# Patient Record
Sex: Female | Born: 1990
Health system: Southern US, Community
[De-identification: ages and names within clinical notes are randomized; demographics above are authoritative.]

## PROBLEM LIST (undated history)

## (undated) DIAGNOSIS — K219 Gastro-esophageal reflux disease without esophagitis: Secondary | ICD-10-CM

## (undated) DIAGNOSIS — I10 Essential (primary) hypertension: Secondary | ICD-10-CM

## (undated) DIAGNOSIS — R000 Tachycardia, unspecified: Secondary | ICD-10-CM

## (undated) DIAGNOSIS — Z789 Other specified health status: Secondary | ICD-10-CM

## (undated) HISTORY — DX: Essential (primary) hypertension: I10

## (undated) HISTORY — PX: TONSILLECTOMY: SUR1361

## (undated) HISTORY — DX: Gastro-esophageal reflux disease without esophagitis: K21.9

## (undated) HISTORY — PX: WISDOM TOOTH EXTRACTION: SHX21

## (undated) HISTORY — DX: Tachycardia, unspecified: R00.0

---
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
## 2011-02-23 ENCOUNTER — Inpatient Hospital Stay (HOSPITAL_COMMUNITY)
Admission: AD | Admit: 2011-02-23 | Discharge: 2011-02-24 | Disposition: A | Discharge status: Home / Self Care | Payer: Self-pay | Source: Ambulatory Visit | Attending: Obstetrics & Gynecology | Admitting: Obstetrics & Gynecology

## 2011-02-23 ENCOUNTER — Encounter (HOSPITAL_COMMUNITY): Payer: Self-pay | Admitting: *Deleted

## 2011-02-23 DIAGNOSIS — N949 Unspecified condition associated with female genital organs and menstrual cycle: Secondary | ICD-10-CM

## 2011-02-23 DIAGNOSIS — A499 Bacterial infection, unspecified: Secondary | ICD-10-CM | POA: Insufficient documentation

## 2011-02-23 DIAGNOSIS — B9689 Other specified bacterial agents as the cause of diseases classified elsewhere: Secondary | ICD-10-CM | POA: Insufficient documentation

## 2011-02-23 DIAGNOSIS — R1032 Left lower quadrant pain: Secondary | ICD-10-CM | POA: Insufficient documentation

## 2011-02-23 DIAGNOSIS — R110 Nausea: Secondary | ICD-10-CM

## 2011-02-23 DIAGNOSIS — R102 Pelvic and perineal pain: Secondary | ICD-10-CM

## 2011-02-23 DIAGNOSIS — N946 Dysmenorrhea, unspecified: Secondary | ICD-10-CM | POA: Insufficient documentation

## 2011-02-23 DIAGNOSIS — N760 Acute vaginitis: Secondary | ICD-10-CM | POA: Insufficient documentation

## 2011-02-23 DIAGNOSIS — R1031 Right lower quadrant pain: Secondary | ICD-10-CM | POA: Insufficient documentation

## 2011-02-23 HISTORY — DX: Other specified health status: Z78.9

## 2011-02-23 LAB — URINALYSIS, ROUTINE W REFLEX MICROSCOPIC
Bilirubin Urine: NEGATIVE
Glucose, UA: NEGATIVE mg/dL
Ketones, ur: NEGATIVE mg/dL
Leukocytes, UA: NEGATIVE
pH: 5.5 (ref 5.0–8.0)

## 2011-02-23 MED ORDER — ONDANSETRON 8 MG PO TBDP
8.0000 mg | ORAL_TABLET | Freq: Once | ORAL | Status: AC
  Administered 2011-02-23: 8 mg via ORAL
  Filled 2011-02-23: qty 1

## 2011-02-23 NOTE — Progress Notes (Signed)
Pt reports pain in upper, lower , and both sides of abd x 2 weeks, also lower back pain . States she takes tylenol and it helps for a bit then the pain comes right back. Denies fever. States she started having nausea and vomiting about a week ago , mostly in the mornings. LMP 01/28/2011, sexually active condoms sometimes. G0

## 2011-02-23 NOTE — Progress Notes (Signed)
Pt presents to [facility] for pain in lower abdomen for last "couple of weeks".  States her back and sides have been hurting also.

## 2011-02-24 ENCOUNTER — Inpatient Hospital Stay (HOSPITAL_COMMUNITY): Payer: Self-pay

## 2011-02-24 LAB — COMPREHENSIVE METABOLIC PANEL
Albumin: 3.8 g/dL (ref 3.5–5.2)
Alkaline Phosphatase: 60 U/L (ref 39–117)
BUN: 14 mg/dL (ref 6–23)
Calcium: 9.6 mg/dL (ref 8.4–10.5)
Potassium: 3.5 mEq/L (ref 3.5–5.1)
Total Protein: 7 g/dL (ref 6.0–8.3)

## 2011-02-24 LAB — CBC
HCT: 38.1 % (ref 36.0–46.0)
MCH: 29.9 pg (ref 26.0–34.0)
MCHC: 36.5 g/dL — ABNORMAL HIGH (ref 30.0–36.0)
RDW: 13.9 % (ref 11.5–15.5)

## 2011-02-24 LAB — WET PREP, GENITAL: Yeast Wet Prep HPF POC: NONE SEEN

## 2011-02-24 LAB — GC/CHLAMYDIA PROBE AMP, GENITAL: Chlamydia, DNA Probe: NEGATIVE

## 2011-02-24 MED ORDER — KETOROLAC TROMETHAMINE 60 MG/2ML IM SOLN
60.0000 mg | Freq: Once | INTRAMUSCULAR | Status: AC
  Administered 2011-02-24: 60 mg via INTRAMUSCULAR
  Filled 2011-02-24: qty 2

## 2011-02-24 MED ORDER — IBUPROFEN 600 MG PO TABS: 600.0000 mg | ORAL_TABLET | Freq: Four times a day (QID) | ORAL | Status: DC | PRN

## 2011-02-24 MED ORDER — METRONIDAZOLE 500 MG PO TABS
500.0000 mg | ORAL_TABLET | Freq: Once | ORAL | Status: AC
  Administered 2011-02-24: 500 mg via ORAL
  Filled 2011-02-24: qty 1

## 2011-02-24 MED ORDER — METRONIDAZOLE 500 MG PO TABS: 500.0000 mg | ORAL_TABLET | Freq: Two times a day (BID) | ORAL | Status: AC

## 2011-02-24 NOTE — Progress Notes (Signed)
Pt denies need for pain med at this time.

## 2011-02-24 NOTE — ED Provider Notes (Signed)
History   The patient is a 20 y.o. year old female who presents to MAU reporting bilat groin pain L>R and LBP  X a few week and vomiting yesterday, resolved. She rates the groin pain 9/10 at its worst. She denies fever, chills, UTI Sx, vaginal discharge, abnormal bleeding or change in partners.   Chief Complaint  Patient presents with  . Abdominal Pain   Abdominal Pain This is a new problem. The current episode started 1 to 4 weeks ago. The onset quality is gradual. The problem occurs intermittently. The problem has been unchanged. The pain is located in the LLQ, RLQ and epigastric region. The pain is at a severity of 9/10. The quality of the pain is sharp. The abdominal pain does not radiate. Associated symptoms include nausea and vomiting. Pertinent negatives include no constipation, diarrhea, dysuria, frequency, hematuria or myalgias.    Pertinent Gynecological History: Menses: flow is moderate, regular every month without intermenstrual spotting and with minimal cramping Contraception: condoms DES exposure: unknown Blood transfusions: none Sexually transmitted diseases: no past history    Past Medical History  Diagnosis Date  . No pertinent past medical history     Past Surgical History  Procedure Date  . Tonsillectomy     No family history on file.  History  Substance Use Topics  . Smoking status: Never Smoker   . Smokeless tobacco: Not on file  . Alcohol Use: No    Allergies: No Known Allergies  Prescriptions prior to admission  Medication Sig Dispense Refill  . acetaminophen (TYLENOL) 500 MG tablet Take 500 mg by mouth every 6 (six) hours as needed. For pain or headache       . ibuprofen (ADVIL,MOTRIN) 200 MG tablet Take 200 mg by mouth every 6 (six) hours as needed. For pain       . triamcinolone (KENALOG) 0.1 % cream Apply 1 application topically 2 (two) times daily. For eczema         Review of Systems  Constitutional: Negative.   Gastrointestinal:  Positive for nausea, vomiting and abdominal pain. Negative for diarrhea, constipation and blood in stool.  Genitourinary: Negative for dysuria, urgency, frequency, hematuria and flank pain.  Musculoskeletal: Negative for myalgias.   Physical Exam   Blood pressure 138/90, pulse 83, temperature 99.2 F (37.3 C), temperature source Oral, resp. rate 18, height 5\' 2"  (1.575 m), weight 60.782 kg (134 lb), SpO2 99.00%.  Physical Exam  Constitutional: She is oriented to person, place, and time. She appears well-developed and well-nourished. No distress.  Cardiovascular: Normal rate.   Respiratory: Effort normal.  GI: Soft. Bowel sounds are normal. She exhibits no distension and no mass. There is tenderness (over L groin). There is no rebound and no guarding.  Genitourinary: Vagina normal and uterus normal. No vaginal discharge found.  Neurological: She is alert and oriented to person, place, and time.  Skin: Skin is warm. She is diaphoretic.  Psychiatric: She has a normal mood and affect.   Results for orders placed during the hospital encounter of 02/23/11 (from the past 24 hour(s))  URINALYSIS, ROUTINE W REFLEX MICROSCOPIC     Status: Abnormal   Collection Time   02/23/11  8:35 PM      Component Value Range   Color, Urine YELLOW  YELLOW    Appearance CLEAR  CLEAR    Specific Gravity, Urine >1.030 (*) 1.005 - 1.030    pH 5.5  5.0 - 8.0    Glucose, UA NEGATIVE  NEGATIVE (mg/dL)  Hgb urine dipstick NEGATIVE  NEGATIVE    Bilirubin Urine NEGATIVE  NEGATIVE    Ketones, ur NEGATIVE  NEGATIVE (mg/dL)   Protein, ur NEGATIVE  NEGATIVE (mg/dL)   Urobilinogen, UA 0.2  0.0 - 1.0 (mg/dL)   Nitrite NEGATIVE  NEGATIVE    Leukocytes, UA NEGATIVE  NEGATIVE   POCT PREGNANCY, URINE     Status: Normal   Collection Time   02/23/11 11:10 PM      Component Value Range   Preg Test, Ur NEGATIVE    WET PREP, GENITAL     Status: Abnormal   Collection Time   02/24/11 12:09 AM      Component Value Range    Yeast, Wet Prep NONE SEEN  NONE SEEN    Trich, Wet Prep NONE SEEN  NONE SEEN    Clue Cells, Wet Prep FEW (*) NONE SEEN    WBC, Wet Prep HPF POC FEW (*) NONE SEEN   CBC     Status: Abnormal   Collection Time   02/24/11  1:20 AM      Component Value Range   WBC 11.7 (*) 4.0 - 10.5 (K/uL)   RBC 4.65  3.87 - 5.11 (MIL/uL)   Hemoglobin 13.9  12.0 - 15.0 (g/dL)   HCT 40.9  81.1 - 91.4 (%)   MCV 81.9  78.0 - 100.0 (fL)   MCH 29.9  26.0 - 34.0 (pg)   MCHC 36.5 (*) 30.0 - 36.0 (g/dL)   RDW 78.2  95.6 - 21.3 (%)   Platelets 336  150 - 400 (K/uL)   US Transvaginal Non-ob  02/24/2011  *RADIOLOGY REPORT*  Clinical Data: Bilateral lower quadrant abdominal pain, right worse than left; pelvic pain.  TRANSABDOMINAL AND TRANSVAGINAL ULTRASOUND OF PELVIS Technique:  Both transabdominal and transvaginal ultrasound examinations of the pelvis were performed. Transabdominal technique was performed for global imaging of the pelvis including uterus, ovaries, adnexal regions, and pelvic cul-de-sac.  Comparison: None.   It was necessary to proceed with endovaginal exam following the transabdominal exam to visualize the uterus and ovaries.  Findings:  Uterus: Normal in size and appearance; measures 6.9 cm in length, 3.3 cm in AP dimension and 4.5 cm in transverse dimension.  Endometrium: Normal in thickness and appearance; measures 0.7 cm in thickness.  Right ovary:  Normal appearance/no adnexal mass; measures 2.2 x 1.7 x 2.0 cm.  Left ovary: Normal appearance/no adnexal mass; measures 3.5 x 1.9 x 2.1 cm.  Other findings: No free fluid is seen within the pelvic cul-de-sac.  IMPRESSION: Normal study. No evidence of pelvic mass or other significant abnormality.  Original Report Authenticated By: Tonia Ghent, M.D.     MAU Course  Procedures   Assessment and Plan  Assessment 1. BV 2. Dysmenorrhea  Plan: 1. Toraldol 2. Flagyl 500 mg PO BID x 7 days 3. GC/CT pending  Dorathy Kinsman 02/24/2011, 1:41 AM

## 2011-02-24 NOTE — Progress Notes (Signed)
SSE done per CNM.  Wet prep and cultures collected.  VE done per CNM.

## 2011-02-24 NOTE — Progress Notes (Signed)
V. Smith, CNM at bedside.  Assessment done and poc discussed with pt.  

## 2011-02-25 NOTE — ED Provider Notes (Signed)
Attestation of Attending Supervision of Advanced Practitioner: Evaluation and management procedures were performed by the PA/NP/CNM/OB Fellow under my supervision/collaboration. Chart reviewed and agree with management and plan.  ANYANWU,UGONNA A 02/25/2011 8:43 AM

## 2012-02-10 ENCOUNTER — Encounter (HOSPITAL_COMMUNITY): Payer: Self-pay | Admitting: Family Medicine

## 2012-02-10 ENCOUNTER — Emergency Department (HOSPITAL_COMMUNITY)
Admission: EM | Admit: 2012-02-10 | Discharge: 2012-02-10 | Disposition: A | Discharge status: Home / Self Care | Payer: Self-pay | Attending: Emergency Medicine | Admitting: Emergency Medicine

## 2012-02-10 DIAGNOSIS — K089 Disorder of teeth and supporting structures, unspecified: Secondary | ICD-10-CM | POA: Insufficient documentation

## 2012-02-10 NOTE — ED Notes (Signed)
Per pt sts left side mouth pain x 1 week. Pt also requesting pregnancy test

## 2012-02-10 NOTE — ED Notes (Signed)
No answer x3

## 2012-02-10 NOTE — ED Notes (Signed)
Called x2 for room. No answer. 

## 2012-03-11 ENCOUNTER — Inpatient Hospital Stay (HOSPITAL_COMMUNITY)
Admission: AD | Admit: 2012-03-11 | Discharge: 2012-03-11 | Disposition: A | Discharge status: Home / Self Care | Payer: Self-pay | Source: Ambulatory Visit | Attending: Obstetrics & Gynecology | Admitting: Obstetrics & Gynecology

## 2012-03-11 ENCOUNTER — Encounter (HOSPITAL_COMMUNITY): Payer: Self-pay

## 2012-03-11 DIAGNOSIS — K219 Gastro-esophageal reflux disease without esophagitis: Secondary | ICD-10-CM | POA: Insufficient documentation

## 2012-03-11 DIAGNOSIS — A5901 Trichomonal vulvovaginitis: Secondary | ICD-10-CM | POA: Insufficient documentation

## 2012-03-11 DIAGNOSIS — R1013 Epigastric pain: Secondary | ICD-10-CM | POA: Insufficient documentation

## 2012-03-11 LAB — URINE MICROSCOPIC-ADD ON

## 2012-03-11 LAB — URINALYSIS, ROUTINE W REFLEX MICROSCOPIC
Glucose, UA: NEGATIVE mg/dL
Ketones, ur: NEGATIVE mg/dL
Nitrite: NEGATIVE
Specific Gravity, Urine: 1.025 (ref 1.005–1.030)
pH: 6 (ref 5.0–8.0)

## 2012-03-11 LAB — WET PREP, GENITAL

## 2012-03-11 LAB — POCT PREGNANCY, URINE: Preg Test, Ur: NEGATIVE

## 2012-03-11 MED ORDER — METRONIDAZOLE 500 MG PO TABS: 500.0000 mg | ORAL_TABLET | Freq: Three times a day (TID) | ORAL | Status: DC

## 2012-03-11 MED ORDER — METRONIDAZOLE 500 MG PO TABS: 500.0000 mg | ORAL_TABLET | Freq: Once | ORAL | Status: DC

## 2012-03-11 NOTE — MAU Note (Signed)
Patient states she has been having upper abdominal and upper back pain for about one month. States every time she eats pizza she has vomiting( since 9-6).Has had a heavy vaginal discharge with an odor.

## 2012-03-11 NOTE — MAU Provider Note (Signed)
CC: Possible Pregnancy, Abdominal Pain and Vaginal Discharge    First Provider Initiated Contact with Patient 03/11/12 1732      HPI: Alicia Rodriguez ia a 21 y.o.  who presents with 2-3 wk history of epigastric abdominal pain that is intermittent and she has noticed it is worse when she eats pizza and other greasy foods. She does have some heartburn that occasionally causes her to vomit occasionally. No vomiting today.  Seen here 02/25/2012 for similar abdominal pain: dx was BV (few clues) and dysmenorrhea. Could not afford the Flagyl (but now can). She had a normal pelvic ultrasound. She was told the pain would likely resolve after her period but it did not. She has not treated the pain. Denies fever, illness, decreased appetite, diarrhea, constipation. No urinary symptoms. For the last week she has had abnormal vaginal discharge which she describes as thick mucusy and malodorous. New sexual partner does she's uncertain if her partner is monogamous. No contraception. Denies abnormal bleeding.   Past Medical History  Diagnosis Date  . No pertinent past medical history     OB History    Grav Para Term Preterm Abortions TAB SAB Ect Mult Living                  Past Surgical History  Procedure Date  . Tonsillectomy     History   Social History  . Marital Status: Single    Spouse Name: N/A    Number of Children: N/A  . Years of Education: N/A   Occupational History  . Not on file.   Social History Main Topics  . Smoking status: Current Every Day Smoker  . Smokeless tobacco: Not on file  . Alcohol Use: No  . Drug Use: No  . Sexually Active:    Other Topics Concern  . Not on file   Social History Narrative  . No narrative on file    No current facility-administered medications on file prior to encounter.   Current Outpatient Prescriptions on File Prior to Encounter  Medication Sig Dispense Refill  . acetaminophen (TYLENOL) 500 MG tablet Take 500 mg by mouth every 6  (six) hours as needed. For pain or headache       . triamcinolone (KENALOG) 0.1 % cream Apply 1 application topically 2 (two) times daily. For eczema         No Known Allergies  ROS: Pertinent items in HPI  PHYSICAL EXAM General: Well nourished, well developed female in no acute distress Cardiovascular: Normal rate Respiratory: Normal effort Abdomen: Soft, minimally tender in epigastric region only. No rebound or guarding. Back: No CVAT Extremities: No edema Neurologic: Alert and oriented  Speculum exam: NEFG; vagina with mucousy and malodorous discharge, no blood; cervix clean Bimanual exam: cervix closed, no CMT; uterus NSSP; no adnexal tenderness or masses  LAB RESULTS Results for orders placed during the hospital encounter of 03/11/12 (from the past 24 hour(s))  URINALYSIS, ROUTINE W REFLEX MICROSCOPIC     Status: Abnormal   Collection Time   03/11/12  5:15 PM      Component Value Range   Color, Urine YELLOW  YELLOW   APPearance HAZY (*) CLEAR   Specific Gravity, Urine 1.025  1.005 - 1.030   pH 6.0  5.0 - 8.0   Glucose, UA NEGATIVE  NEGATIVE mg/dL   Hgb urine dipstick NEGATIVE  NEGATIVE   Bilirubin Urine NEGATIVE  NEGATIVE   Ketones, ur NEGATIVE  NEGATIVE mg/dL   Protein, ur  NEGATIVE  NEGATIVE mg/dL   Urobilinogen, UA 0.2  0.0 - 1.0 mg/dL   Nitrite NEGATIVE  NEGATIVE   Leukocytes, UA TRACE (*) NEGATIVE  URINE MICROSCOPIC-ADD ON     Status: Abnormal   Collection Time   03/11/12  5:15 PM      Component Value Range   Squamous Epithelial / LPF FEW (*) RARE   WBC, UA 0-2  <3 WBC/hpf   Bacteria, UA FEW (*) RARE  POCT PREGNANCY, URINE     Status: Normal   Collection Time   03/11/12  5:25 PM      Component Value Range   Preg Test, Ur NEGATIVE  NEGATIVE  WET PREP, GENITAL     Status: Abnormal   Collection Time   03/11/12  5:35 PM      Component Value Range   Yeast Wet Prep HPF POC NONE SEEN  NONE SEEN   Trich, Wet Prep FEW (*) NONE SEEN   Clue Cells Wet Prep HPF POC FEW  (*) NONE SEEN   WBC, Wet Prep HPF POC MANY (*) NONE SEEN     ASSESSMENT  1. GERD (gastroesophageal reflux disease)   2. Trichomonal vaginitis     PLAN DIscharge home. See AVS for patient education on reflux and GERD diet. Try antacids  Rx Falgyl 2 Gm po  If worse abdominal pain advised to go to medical ED or Urgent Care.  Danae Orleans, CNM 03/11/2012 5:33 PM

## 2012-03-11 NOTE — MAU Note (Signed)
Patient states she has done two home pregnancy tests. A night sample was negative and an am sample the next morning was positive.

## 2012-03-11 NOTE — MAU Note (Signed)
Pt notes vaginal discharge with odor, having upper abdominal pain, possibly pregnant.

## 2012-03-12 LAB — GC/CHLAMYDIA PROBE AMP, GENITAL
Chlamydia, DNA Probe: NEGATIVE
GC Probe Amp, Genital: NEGATIVE

## 2013-04-06 ENCOUNTER — Emergency Department (HOSPITAL_COMMUNITY)
Admission: EM | Admit: 2013-04-06 | Discharge: 2013-04-06 | Disposition: A | Discharge status: Home / Self Care | Payer: Self-pay | Attending: Emergency Medicine | Admitting: Emergency Medicine

## 2013-04-06 ENCOUNTER — Emergency Department (HOSPITAL_COMMUNITY)
Admission: EM | Admit: 2013-04-06 | Discharge: 2013-04-06 | Disposition: A | Discharge status: Home / Self Care | Payer: Self-pay | Source: Home / Self Care

## 2013-04-06 ENCOUNTER — Encounter (HOSPITAL_COMMUNITY): Payer: Self-pay | Admitting: Emergency Medicine

## 2013-04-06 DIAGNOSIS — F17200 Nicotine dependence, unspecified, uncomplicated: Secondary | ICD-10-CM | POA: Insufficient documentation

## 2013-04-06 DIAGNOSIS — R42 Dizziness and giddiness: Secondary | ICD-10-CM | POA: Insufficient documentation

## 2013-04-06 DIAGNOSIS — IMO0002 Reserved for concepts with insufficient information to code with codable children: Secondary | ICD-10-CM | POA: Insufficient documentation

## 2013-04-06 DIAGNOSIS — R1084 Generalized abdominal pain: Secondary | ICD-10-CM | POA: Insufficient documentation

## 2013-04-06 DIAGNOSIS — R112 Nausea with vomiting, unspecified: Secondary | ICD-10-CM | POA: Insufficient documentation

## 2013-04-06 DIAGNOSIS — R109 Unspecified abdominal pain: Secondary | ICD-10-CM

## 2013-04-06 DIAGNOSIS — Z3202 Encounter for pregnancy test, result negative: Secondary | ICD-10-CM | POA: Insufficient documentation

## 2013-04-06 LAB — URINALYSIS, ROUTINE W REFLEX MICROSCOPIC
Glucose, UA: NEGATIVE mg/dL
Hgb urine dipstick: NEGATIVE
Ketones, ur: 15 mg/dL — AB
Protein, ur: NEGATIVE mg/dL
pH: 6 (ref 5.0–8.0)

## 2013-04-06 LAB — COMPREHENSIVE METABOLIC PANEL
ALT: 12 U/L (ref 0–35)
Albumin: 3.7 g/dL (ref 3.5–5.2)
Alkaline Phosphatase: 64 U/L (ref 39–117)
BUN: 12 mg/dL (ref 6–23)
Chloride: 102 mEq/L (ref 96–112)
Glucose, Bld: 81 mg/dL (ref 70–99)
Potassium: 4 mEq/L (ref 3.5–5.1)
Sodium: 137 mEq/L (ref 135–145)
Total Bilirubin: 0.4 mg/dL (ref 0.3–1.2)
Total Protein: 7.4 g/dL (ref 6.0–8.3)

## 2013-04-06 LAB — CBC WITH DIFFERENTIAL/PLATELET
Basophils Relative: 0 % (ref 0–1)
Eosinophils Absolute: 0.3 10*3/uL (ref 0.0–0.7)
Hemoglobin: 15.6 g/dL — ABNORMAL HIGH (ref 12.0–15.0)
Lymphs Abs: 4.1 10*3/uL — ABNORMAL HIGH (ref 0.7–4.0)
Monocytes Relative: 7 % (ref 3–12)
Neutro Abs: 5.6 10*3/uL (ref 1.7–7.7)
Neutrophils Relative %: 52 % (ref 43–77)
Platelets: 330 10*3/uL (ref 150–400)
RBC: 5.07 MIL/uL (ref 3.87–5.11)

## 2013-04-06 MED ORDER — HYDROCODONE-ACETAMINOPHEN 5-325 MG PO TABS: ORAL_TABLET | ORAL | Status: DC

## 2013-04-06 MED ORDER — ONDANSETRON 4 MG PO TBDP: 4.0000 mg | ORAL_TABLET | Freq: Three times a day (TID) | ORAL | Status: DC | PRN

## 2013-04-06 MED ORDER — OMEPRAZOLE 20 MG PO CPDR: DELAYED_RELEASE_CAPSULE | ORAL | Status: DC

## 2013-04-06 NOTE — ED Notes (Signed)
Report given to Brittney, RN

## 2013-04-06 NOTE — ED Notes (Signed)
Pt reports generalized sharp abd pains and n/v since Tuesday, feels lightheaded. Denies diarrhea. Denies urinary or vaginal symptoms.

## 2013-04-06 NOTE — ED Provider Notes (Signed)
Medical screening examination/treatment/procedure(s) were performed by non-physician practitioner and as supervising physician I was immediately available for consultation/collaboration.  EKG Interpretation   None         Richardean Canal, MD 04/06/13 2013

## 2013-04-06 NOTE — ED Notes (Signed)
Blood was collected by phlebotomy.

## 2013-04-06 NOTE — ED Notes (Signed)
Pt denies nausea at this time. Pt PO challenged. Resting comfortably. Denies pain.

## 2013-04-06 NOTE — ED Provider Notes (Signed)
CSN: [id number]     Arrival date & time 04/06/13  1715 History   First MD Initiated Contact with Patient 04/06/13 1727     Chief Complaint  Patient presents with  . Abdominal Pain  . Emesis   (Consider location/radiation/quality/duration/timing/severity/associated sxs/prior Treatment) HPI Comments: Patient presents complaint of sharp, generalized, nonradiating abdominal pain with associated nausea and vomiting for the past 2 days. Patient also states that she feels lightheaded but has not passed out. She denies fever, diarrhea, constipation, urinary symptoms. No vaginal discharge or vaginal bleeding. Her last menstrual period was 03/01/13. She essentially active. Patient states that she vomits after having a "heavy" meal. She feels that greasy foods do make the pain worse. Patient denies heavy alcohol or NSAID use. She has taken Tylenol without relief of pain. Patient has had similar pain in the past that she told was ulcers. Onset of symptoms gradual. Course is intermittent.  Patient is a 22 y.o. female presenting with abdominal pain and vomiting. The history is provided by the patient.  Abdominal Pain Associated symptoms: nausea and vomiting   Associated symptoms: no chest pain, no constipation, no cough, no diarrhea, no dysuria, no fever, no hematuria, no sore throat, no vaginal bleeding and no vaginal discharge   Emesis Associated symptoms: abdominal pain   Associated symptoms: no diarrhea, no headaches, no myalgias and no sore throat     Past Medical History  Diagnosis Date  . No pertinent past medical history    Past Surgical History  Procedure Laterality Date  . Tonsillectomy     Family History  Problem Relation Age of Onset  . Other Neg Hx    History  Substance Use Topics  . Smoking status: Current Every Day Smoker -- 0.25 packs/day    Types: Cigarettes  . Smokeless tobacco: Never Used  . Alcohol Use: No   OB History   Grav Para Term Preterm Abortions TAB SAB Ect Mult  Living   0              Review of Systems  Constitutional: Negative for fever.  HENT: Negative for rhinorrhea and sore throat.   Eyes: Negative for redness.  Respiratory: Negative for cough.   Cardiovascular: Negative for chest pain.  Gastrointestinal: Positive for nausea, vomiting and abdominal pain. Negative for diarrhea and constipation.  Genitourinary: Negative for dysuria, frequency, hematuria, vaginal bleeding and vaginal discharge.  Musculoskeletal: Negative for myalgias.  Skin: Negative for rash.  Neurological: Negative for headaches.    Allergies  Review of patient's allergies indicates no known allergies.  Home Medications   Current Outpatient Rx  Name  Route  Sig  Dispense  Refill  . acetaminophen (TYLENOL) 500 MG tablet   Oral   Take 500 mg by mouth every 6 (six) hours as needed. For pain or headache          . metroNIDAZOLE (FLAGYL) 500 MG tablet   Oral   Take 1 tablet (500 mg total) by mouth once.   4 tablet   0     Take 4 tabs once   . triamcinolone (KENALOG) 0.1 % cream   Topical   Apply 1 application topically 2 (two) times daily. For eczema           BP 127/79  Pulse 90  Temp(Src) 98.9 F (37.2 C) (Oral)  Resp 20  Wt 150 lb (68.04 kg)  BMI 26.98 kg/m2  SpO2 98%  LMP 03/02/2013 Physical Exam  Nursing note and vitals reviewed. Constitutional:  She appears well-developed and well-nourished.  HENT:  Head: Normocephalic and atraumatic.  Eyes: Conjunctivae are normal. Right eye exhibits no discharge. Left eye exhibits no discharge.  Neck: Normal range of motion. Neck supple.  Cardiovascular: Normal rate, regular rhythm and normal heart sounds.   Pulmonary/Chest: Effort normal and breath sounds normal.  Abdominal: Soft. There is tenderness in the right upper quadrant, epigastric area and left upper quadrant. There is no rebound and no guarding.  Neurological: She is alert.  Skin: Skin is warm and dry.  Psychiatric: She has a normal mood and  affect.    ED Course  Procedures (including critical care time) Labs Review Labs Reviewed  CBC WITH DIFFERENTIAL - Abnormal; Notable for the following:    WBC 10.8 (*)    Hemoglobin 15.6 (*)    MCHC 36.8 (*)    Lymphs Abs 4.1 (*)    All other components within normal limits  URINALYSIS, ROUTINE W REFLEX MICROSCOPIC - Abnormal; Notable for the following:    Bilirubin Urine SMALL (*)    Ketones, ur 15 (*)    All other components within normal limits  COMPREHENSIVE METABOLIC PANEL  POCT PREGNANCY, URINE   Imaging Review No results found.  EKG Interpretation   None      6:06 PM Patient seen and examined. Work-up initiated. Medications ordered.   Vital signs reviewed and are as follows: Filed Vitals:   04/06/13 1718  BP: 127/79  Pulse: 90  Temp: 98.9 F (37.2 C)  Resp: 20   8:06 PM patient informed of all results. She is dressed in room, ready to go. Patient seems relieved that she is not pregnant.  Will discharge to home with omeprazole, pain medicine, nausea medicine.  The patient was urged to return to the Emergency Department immediately with worsening of current symptoms, worsening abdominal pain, persistent vomiting, blood noted in stools, fever, or any other concerns. The patient verbalized understanding.     MDM   1. Abdominal pain    Patient with abdominal pain, upper abdominal but still diffuse and non-localized.  Vitals are stable, no fever.  No signs of dehydration, tolerating PO's.  Lungs are clear.  No focal abdominal pain, no concern for appendicitis, cholecystitis, pancreatitis, ruptured viscus, UTI, kidney stone, or any other abdominal etiology.  Supportive therapy indicated with return if symptoms worsen.  Patient counseled.     Renne Crigler, PA-C 04/06/13 2009

## 2013-07-16 ENCOUNTER — Encounter (HOSPITAL_BASED_OUTPATIENT_CLINIC_OR_DEPARTMENT_OTHER): Payer: Self-pay | Admitting: Emergency Medicine

## 2013-07-16 ENCOUNTER — Emergency Department (HOSPITAL_BASED_OUTPATIENT_CLINIC_OR_DEPARTMENT_OTHER)
Admission: EM | Admit: 2013-07-16 | Discharge: 2013-07-17 | Disposition: A | Discharge status: Home / Self Care | Payer: Self-pay | Attending: Emergency Medicine | Admitting: Emergency Medicine

## 2013-07-16 DIAGNOSIS — Z792 Long term (current) use of antibiotics: Secondary | ICD-10-CM | POA: Insufficient documentation

## 2013-07-16 DIAGNOSIS — N390 Urinary tract infection, site not specified: Secondary | ICD-10-CM

## 2013-07-16 DIAGNOSIS — R112 Nausea with vomiting, unspecified: Secondary | ICD-10-CM | POA: Insufficient documentation

## 2013-07-16 DIAGNOSIS — M549 Dorsalgia, unspecified: Secondary | ICD-10-CM

## 2013-07-16 DIAGNOSIS — L259 Unspecified contact dermatitis, unspecified cause: Secondary | ICD-10-CM | POA: Insufficient documentation

## 2013-07-16 DIAGNOSIS — A5901 Trichomonal vulvovaginitis: Secondary | ICD-10-CM

## 2013-07-16 DIAGNOSIS — Z79899 Other long term (current) drug therapy: Secondary | ICD-10-CM | POA: Insufficient documentation

## 2013-07-16 DIAGNOSIS — F17200 Nicotine dependence, unspecified, uncomplicated: Secondary | ICD-10-CM | POA: Insufficient documentation

## 2013-07-16 LAB — WET PREP, GENITAL: Yeast Wet Prep HPF POC: NONE SEEN

## 2013-07-16 LAB — PREGNANCY, URINE: Preg Test, Ur: NEGATIVE

## 2013-07-16 LAB — URINALYSIS, ROUTINE W REFLEX MICROSCOPIC
Bilirubin Urine: NEGATIVE
Glucose, UA: NEGATIVE mg/dL
Hgb urine dipstick: NEGATIVE
KETONES UR: NEGATIVE mg/dL
NITRITE: NEGATIVE
PROTEIN: NEGATIVE mg/dL
Specific Gravity, Urine: 1.017 (ref 1.005–1.030)
Urobilinogen, UA: 1 mg/dL (ref 0.0–1.0)
pH: 7.5 (ref 5.0–8.0)

## 2013-07-16 LAB — URINE MICROSCOPIC-ADD ON

## 2013-07-16 MED ORDER — METRONIDAZOLE 500 MG PO TABS: 500.0000 mg | ORAL_TABLET | Freq: Two times a day (BID) | ORAL | Status: DC

## 2013-07-16 MED ORDER — SULFAMETHOXAZOLE-TRIMETHOPRIM 800-160 MG PO TABS: 1.0000 | ORAL_TABLET | Freq: Two times a day (BID) | ORAL | Status: AC

## 2013-07-16 MED ORDER — CYCLOBENZAPRINE HCL 5 MG PO TABS: 5.0000 mg | ORAL_TABLET | Freq: Three times a day (TID) | ORAL | Status: DC | PRN

## 2013-07-16 MED ORDER — AZITHROMYCIN 250 MG PO TABS
1000.0000 mg | ORAL_TABLET | Freq: Once | ORAL | Status: AC
  Administered 2013-07-16: 1000 mg via ORAL
  Filled 2013-07-16: qty 4

## 2013-07-16 MED ORDER — CEFTRIAXONE SODIUM 250 MG IJ SOLR
250.0000 mg | Freq: Once | INTRAMUSCULAR | Status: AC
  Administered 2013-07-16: 250 mg via INTRAMUSCULAR
  Filled 2013-07-16: qty 250

## 2013-07-16 MED ORDER — LIDOCAINE HCL (PF) 1 % IJ SOLN
INTRAMUSCULAR | Status: AC
  Administered 2013-07-16: 5 mL
  Filled 2013-07-16: qty 5

## 2013-07-16 MED ORDER — KETOROLAC TROMETHAMINE 30 MG/ML IJ SOLN
30.0000 mg | Freq: Once | INTRAMUSCULAR | Status: AC
  Administered 2013-07-16: 30 mg via INTRAMUSCULAR
  Filled 2013-07-16: qty 1

## 2013-07-16 NOTE — ED Notes (Signed)
Patient c/o back pain that started Monday. Denies any other symptoms. Does not recall any recent injuries.

## 2013-07-16 NOTE — Discharge Instructions (Signed)
Your urine tonight appears to have infection. Your wet prep show that you have trichomonas. It is important that your sex partner be treated at the same time as you and no sex for one week after you have both been treated. Men usually do not have symptoms and will re infect you. We have tested you tonight for Gonorrhea and Chlamydia. The cultures will not be back for 24 hours. We will only call if the results are positive. You and your partner should follow up with the health department for further STD testing such as Syphilis, Hepatitis and HIV.

## 2013-07-16 NOTE — ED Provider Notes (Signed)
CSN: [id number]     Arrival date & time 07/16/13  2138 History   First MD Initiated Contact with Patient 07/16/13 2140     Chief Complaint  Patient presents with  . Back Pain   (Consider location/radiation/quality/duration/timing/severity/associated sxs/prior Treatment) Patient is a 23 y.o. female presenting with back pain. The history is provided by the patient.  Back Pain Location:  Generalized Quality:  Shooting and stabbing Radiates to: stomach. Pain severity:  Severe Pain is:  Same all the time Onset quality:  Gradual Duration:  6 days Timing:  Constant Progression:  Worsening Chronicity:  New Context: not falling, not lifting heavy objects, not MVA, not occupational injury, not physical stress and not recent injury   Relieved by:  Nothing Worsened by:  Movement, deep breathing, coughing, bending and ambulation Ineffective treatments: tylenol. Associated symptoms: abdominal pain   Associated symptoms: no bladder incontinence, no bowel incontinence, no fever, no leg pain, no numbness, no pelvic pain and no weakness  Dysuria: "funny feeling with urination"    [place] S Arpin is a 23 y.o. female who presents to the ED with back pain that started 6 days ago. She states that the pain is all over her back and radiates to the abdomen sometimes. She has had back pain in the past but this is different. She denies any injury. She states the only UTI symptoms she has is that it feels "funny" when she voids. She is sexually active without birth control. She denies loss of control of bladder or bowels.   Past Medical History  Diagnosis Date  . No pertinent past medical history    Past Surgical History  Procedure Laterality Date  . Tonsillectomy     Family History  Problem Relation Age of Onset  . Other Neg Hx    History  Substance Use Topics  . Smoking status: Current Every Day Smoker -- 0.25 packs/day    Types: Cigarettes  . Smokeless tobacco: Never Used  . Alcohol Use: No    OB History   Grav Para Term Preterm Abortions TAB SAB Ect Mult Living   0              Review of Systems  Constitutional: Negative for fever and chills.  HENT: Negative.   Eyes: Negative for visual disturbance.  Gastrointestinal: Positive for nausea, vomiting and abdominal pain. Negative for bowel incontinence.  Genitourinary: Negative for bladder incontinence, urgency, frequency and pelvic pain. Dysuria: "funny feeling with urination"  Musculoskeletal: Positive for back pain.  Skin: Rash: eczema.  Neurological: Negative for weakness and numbness.  Psychiatric/Behavioral: The patient is not nervous/anxious.     Allergies  Review of patient's allergies indicates no known allergies.  Home Medications   Current Outpatient Rx  Name  Route  Sig  Dispense  Refill  . cyclobenzaprine (FLEXERIL) 5 MG tablet   Oral   Take 1 tablet (5 mg total) by mouth 3 (three) times daily as needed for muscle spasms.   30 tablet   0   . HYDROcodone-acetaminophen (NORCO/VICODIN) 5-325 MG per tablet      Take 1-2 tablets every 6 hours as needed for severe pain   12 tablet   0   . metroNIDAZOLE (FLAGYL) 500 MG tablet   Oral   Take 1 tablet (500 mg total) by mouth 2 (two) times daily.   14 tablet   0   . omeprazole (PRILOSEC) 20 MG capsule      Take one capsule PO twice a day  for 3 days, then one capsule PO once a day.   20 capsule   0   . ondansetron (ZOFRAN ODT) 4 MG disintegrating tablet   Oral   Take 1 tablet (4 mg total) by mouth every 8 (eight) hours as needed for nausea.   6 tablet   0   . sulfamethoxazole-trimethoprim (BACTRIM DS,SEPTRA DS) 800-160 MG per tablet   Oral   Take 1 tablet by mouth 2 (two) times daily.   14 tablet   0    BP 118/90  Pulse 85  Temp(Src) 98.6 F (37 C) (Oral)  Resp 18  Ht 5\' 2"  (1.575 m)  Wt 150 lb (68.04 kg)  BMI 27.43 kg/m2  SpO2 100%  LMP 07/10/2013 Physical Exam  Nursing note and vitals reviewed. Constitutional: She is oriented to  person, place, and time. She appears well-developed and well-nourished. No distress.  HENT:  Head: Normocephalic and atraumatic.  Eyes: EOM are normal.  Neck: Neck supple.  Cardiovascular: Normal rate and regular rhythm.   Pulmonary/Chest: Effort normal. She has no wheezes. She has no rales.  Abdominal: Soft. Normal appearance and bowel sounds are normal. There is tenderness in the suprapubic area, left upper quadrant and left lower quadrant. There is CVA tenderness.  Genitourinary:  External genitalia without lesions. Watery discharge vaginal vault. Cervix with strawberry appearance, friable. No CMT, no adnexal tenderness, uterus without palpable enlargement.   Musculoskeletal: Normal range of motion.       Lumbar back: She exhibits tenderness and pain. She exhibits normal range of motion, no deformity, no spasm and normal pulse.       Back:  Tenderness with palpation bilateral lower back. No tenderness over the spinal column. Pedal pulses equal and strong bilateral, adequate circulation, good touch sensation. Straight leg raises without pain or difficulty.   Neurological: She is alert and oriented to person, place, and time. She has normal strength. No cranial nerve deficit or sensory deficit. Gait normal.  Reflex Scores:      Bicep reflexes are 2+ on the right side and 2+ on the left side.      Brachioradialis reflexes are 2+ on the right side and 2+ on the left side.      Patellar reflexes are 2+ on the right side and 2+ on the left side. Skin: Skin is warm and dry.  Psychiatric: She has a normal mood and affect. Her behavior is normal.   Results for orders placed during the hospital encounter of 07/16/13 (from the past 24 hour(s))  URINALYSIS, ROUTINE W REFLEX MICROSCOPIC     Status: Abnormal   Collection Time    07/16/13 10:00 PM      Result Value Range   Color, Urine YELLOW  YELLOW   APPearance CLOUDY (*) CLEAR   Specific Gravity, Urine 1.017  1.005 - 1.030   pH 7.5  5.0 - 8.0     Glucose, UA NEGATIVE  NEGATIVE mg/dL   Hgb urine dipstick NEGATIVE  NEGATIVE   Bilirubin Urine NEGATIVE  NEGATIVE   Ketones, ur NEGATIVE  NEGATIVE mg/dL   Protein, ur NEGATIVE  NEGATIVE mg/dL   Urobilinogen, UA 1.0  0.0 - 1.0 mg/dL   Nitrite NEGATIVE  NEGATIVE   Leukocytes, UA LARGE (*) NEGATIVE  PREGNANCY, URINE     Status: None   Collection Time    07/16/13 10:00 PM      Result Value Range   Preg Test, Ur NEGATIVE  NEGATIVE  URINE MICROSCOPIC-ADD ON  Status: Abnormal   Collection Time    07/16/13 10:00 PM      Result Value Range   Squamous Epithelial / LPF RARE  RARE   WBC, UA TOO NUMEROUS TO COUNT  <3 WBC/hpf   RBC / HPF 0-2  <3 RBC/hpf   Bacteria, UA MANY (*) RARE   Urine-Other MUCOUS PRESENT    WET PREP, GENITAL     Status: Abnormal   Collection Time    07/16/13 10:45 PM      Result Value Range   Yeast Wet Prep HPF POC NONE SEEN  NONE SEEN   Trich, Wet Prep MODERATE (*) NONE SEEN   Clue Cells Wet Prep HPF POC MODERATE (*) NONE SEEN   WBC, Wet Prep HPF POC FEW (*) NONE SEEN    ED Course  Procedures Rocephin 250 mg. IM, Zithromax 1 gram PO given her in the ED. MDM  23 y.o. female with low back pain that radiates to the lower abdomen x 6 days. Will treat for cervicitis, BV, trichomonas and UTI. Will also treat low back pain. I have reviewed this patient's vital signs, nurses notes, appropriate labs and discussed findings and plan of care with the patient. I encouraged patient to go with her sex partner to the health department for further testing and treatment. Patient voices understanding. Stable for discharge without concern for TOA or pyelonephritis at this time. Patient remains afebrile, she has no pelvic pain on exam. Cultures for GC, Chlamydia and urine pending.    Medication List    TAKE these medications       cyclobenzaprine 5 MG tablet  Commonly known as:  FLEXERIL  Take 1 tablet (5 mg total) by mouth 3 (three) times daily as needed for muscle spasms.      metroNIDAZOLE 500 MG tablet  Commonly known as:  FLAGYL  Take 1 tablet (500 mg total) by mouth 2 (two) times daily.     sulfamethoxazole-trimethoprim 800-160 MG per tablet  Commonly known as:  BACTRIM DS,SEPTRA DS  Take 1 tablet by mouth 2 (two) times daily.      ASK your doctor about these medications       HYDROcodone-acetaminophen 5-325 MG per tablet  Commonly known as:  NORCO/VICODIN  Take 1-2 tablets every 6 hours as needed for severe pain     omeprazole 20 MG capsule  Commonly known as:  PRILOSEC  Take one capsule PO twice a day for 3 days, then one capsule PO once a day.     ondansetron 4 MG disintegrating tablet  Commonly known as:  ZOFRAN ODT  Take 1 tablet (4 mg total) by mouth every 8 (eight) hours as needed for nausea.         Janne Napoleon, [place] 07/16/13 [phone]

## 2013-07-17 LAB — GC/CHLAMYDIA PROBE AMP
CT Probe RNA: POSITIVE — AB
GC PROBE AMP APTIMA: NEGATIVE

## 2013-07-19 LAB — URINE CULTURE

## 2013-07-19 NOTE — ED Provider Notes (Signed)
Medical screening examination/treatment/procedure(s) were performed by non-physician practitioner and as supervising physician I was immediately available for consultation/collaboration.  EKG Interpretation   None         Kathlen Sakurai, MD 07/19/13 0714 

## 2013-07-22 ENCOUNTER — Telehealth (HOSPITAL_COMMUNITY): Payer: Self-pay | Admitting: Emergency Medicine

## 2013-07-22 NOTE — ED Notes (Signed)
Post ED Visit - Positive Culture Follow-up  Culture report reviewed by antimicrobial stewardship pharmacist: []  Wes Dulaney, Pharm.D., BCPS []  Celedonio MiyamotoJeremy Frens, Pharm.D., BCPS []  Georgina PillionElizabeth Martin, [address]Ph[address]D., BCPS [x]  BraddockMinh Pham, [place]arm[place] BCPS, AAHIVP []  Estella HuskMichelle Turner, Pharm.D., BCPS, AAHIVP  Positive urine culture Treated with Sulfa-Trimeth, organism sensitive to the same and no further patient follow-up is required at this time.  Zeb ComfortHolland, Whittney Steenson 07/22/2013, 9:48 AM

## 2014-02-26 ENCOUNTER — Emergency Department (HOSPITAL_BASED_OUTPATIENT_CLINIC_OR_DEPARTMENT_OTHER)
Admission: EM | Admit: 2014-02-26 | Discharge: 2014-02-26 | Disposition: A | Discharge status: Home / Self Care | Payer: Self-pay | Attending: Emergency Medicine | Admitting: Emergency Medicine

## 2014-02-26 ENCOUNTER — Encounter (HOSPITAL_BASED_OUTPATIENT_CLINIC_OR_DEPARTMENT_OTHER): Payer: Self-pay | Admitting: Emergency Medicine

## 2014-02-26 DIAGNOSIS — Z79899 Other long term (current) drug therapy: Secondary | ICD-10-CM | POA: Insufficient documentation

## 2014-02-26 DIAGNOSIS — Z792 Long term (current) use of antibiotics: Secondary | ICD-10-CM | POA: Insufficient documentation

## 2014-02-26 DIAGNOSIS — K089 Disorder of teeth and supporting structures, unspecified: Secondary | ICD-10-CM | POA: Insufficient documentation

## 2014-02-26 DIAGNOSIS — F17200 Nicotine dependence, unspecified, uncomplicated: Secondary | ICD-10-CM | POA: Insufficient documentation

## 2014-02-26 DIAGNOSIS — K0889 Other specified disorders of teeth and supporting structures: Secondary | ICD-10-CM

## 2014-02-26 MED ORDER — OXYCODONE-ACETAMINOPHEN 5-325 MG PO TABS
1.0000 | ORAL_TABLET | Freq: Once | ORAL | Status: AC
  Administered 2014-02-26: 1 via ORAL
  Filled 2014-02-26: qty 1

## 2014-02-26 MED ORDER — IBUPROFEN 600 MG PO TABS: 600.0000 mg | ORAL_TABLET | Freq: Four times a day (QID) | ORAL | Status: DC | PRN

## 2014-02-26 MED ORDER — OXYCODONE-ACETAMINOPHEN 5-325 MG PO TABS: 1.0000 | ORAL_TABLET | Freq: Four times a day (QID) | ORAL | Status: DC | PRN

## 2014-02-26 MED ORDER — PENICILLIN V POTASSIUM 500 MG PO TABS: 500.0000 mg | ORAL_TABLET | Freq: Three times a day (TID) | ORAL | Status: DC

## 2014-02-26 NOTE — ED Notes (Signed)
Dental pain for a few days.

## 2014-02-26 NOTE — ED Provider Notes (Signed)
CSN: [id number]     Arrival date & time 02/26/14  1701 History   First MD Initiated Contact with Patient 02/26/14 1753     Chief Complaint  Patient presents with  . Dental Pain     (Consider location/radiation/quality/duration/timing/severity/associated sxs/prior Treatment) HPI Pt presenting with c/o left posterior lowar molar dental pain.  Pt states her tooth broke off several months ago and now pain has been going on for the past 3 days.  Also has pain beneath her left jaw.  No fever/chills.  No vomiting.  No difficulty swallowing or breathing.  Does not have a dentist.  There are no other associated systemic symptoms, there are no other alleviating or modifying factors.  Pain is throbbing and constant.  Has tried ibuprofen wtihout relief.  Past Medical History  Diagnosis Date  . No pertinent past medical history    Past Surgical History  Procedure Laterality Date  . Tonsillectomy     Family History  Problem Relation Age of Onset  . Other Neg Hx    History  Substance Use Topics  . Smoking status: Current Every Day Smoker -- 0.25 packs/day    Types: Cigarettes  . Smokeless tobacco: Never Used  . Alcohol Use: No   OB History   Grav Para Term Preterm Abortions TAB SAB Ect Mult Living   0              Review of Systems ROS reviewed and all otherwise negative except for mentioned in HPI    Allergies  Review of patient's allergies indicates no known allergies.  Home Medications   Prior to Admission medications   Medication Sig Start Date End Date Taking? Authorizing Provider  cyclobenzaprine (FLEXERIL) 5 MG tablet Take 1 tablet (5 mg total) by mouth 3 (three) times daily as needed for muscle spasms. 07/16/13   Hope Orlene Och, NP  HYDROcodone-acetaminophen (NORCO/VICODIN) 5-325 MG per tablet Take 1-2 tablets every 6 hours as needed for severe pain 04/06/13   Renne Crigler, PA-C  ibuprofen (ADVIL,MOTRIN) 600 MG tablet Take 1 tablet (600 mg total) by mouth every 6 (six) hours  as needed. 02/26/14   Ethelda Chick, MD  metroNIDAZOLE (FLAGYL) 500 MG tablet Take 1 tablet (500 mg total) by mouth 2 (two) times daily. 07/16/13   Hope Orlene Och, NP  omeprazole (PRILOSEC) 20 MG capsule Take one capsule PO twice a day for 3 days, then one capsule PO once a day. 04/06/13   Renne Crigler, PA-C  ondansetron (ZOFRAN ODT) 4 MG disintegrating tablet Take 1 tablet (4 mg total) by mouth every 8 (eight) hours as needed for nausea. 04/06/13   Renne Crigler, PA-C  oxyCODONE-acetaminophen (PERCOCET/ROXICET) 5-325 MG per tablet Take 1-2 tablets by mouth every 6 (six) hours as needed for severe pain. 02/26/14   Ethelda Chick, MD  penicillin v potassium (VEETID) 500 MG tablet Take 1 tablet (500 mg total) by mouth 3 (three) times daily. 02/26/14   Ethelda Chick, MD   BP 136/88  Pulse 80  Temp(Src) 97.9 F (36.6 C) (Oral)  Resp 18  Ht  (1.575 m)  Wt 136 lb (61.689 kg)  BMI 24.87 kg/m2  SpO2 100%  LMP 01/27/2014 Vitals reviewed Physical Exam Physical Examination: General appearance - alert, well appearing, and in no distress Mental status - alert, oriented to person, place, and time Eyes - no conjunctival injection, no scleral icterus, left lower posterior molar eroded down to the gum line with surrounding gingival erythema, no  significant abscess visualized, mild left sided facial swelling, no swelling under tongue Mouth - mucous membranes moist, pharynx normal without lesions Neck - supple, no significant adenopathy Chest - clear to auscultation, no wheezes, rales or rhonchi, symmetric air entry Heart - normal rate, regular rhythm, normal S1, S2, no murmurs, rubs, clicks or gallops Extremities - peripheral pulses normal, no pedal edema, no clubbing or cyanosis Skin - normal coloration and turgor, no rashes  ED Course  Procedures (including critical care time) Labs Review Labs Reviewed - No data to display  Imaging Review No results found.   EKG Interpretation None       MDM   Final diagnoses:  Pain, dental    Pt presenting with c/o left lower dental pain, tooth is eroded down to the gum line.  Pt started on abx and pain medication.  No dentist on call through [facility] system today- patient encouraged of the importance of obtaining followup with the dentist of her choice.      Ethelda Chick, MD 02/26/14 [phone]

## 2014-02-26 NOTE — Discharge Instructions (Signed)
Return to the ED with any concerns including fever/chills, vomiting and not able to keep down liquids or antibitoics, difficulty breathing or swallowing, decreased level of alertness/lethargy, or any other alarming symptoms °

## 2015-02-05 ENCOUNTER — Emergency Department (HOSPITAL_BASED_OUTPATIENT_CLINIC_OR_DEPARTMENT_OTHER)
Admission: EM | Admit: 2015-02-05 | Discharge: 2015-02-05 | Disposition: A | Discharge status: Home / Self Care | Payer: Self-pay | Attending: Emergency Medicine | Admitting: Emergency Medicine

## 2015-02-05 ENCOUNTER — Encounter (HOSPITAL_BASED_OUTPATIENT_CLINIC_OR_DEPARTMENT_OTHER): Payer: Self-pay | Admitting: *Deleted

## 2015-02-05 DIAGNOSIS — Z720 Tobacco use: Secondary | ICD-10-CM | POA: Insufficient documentation

## 2015-02-05 DIAGNOSIS — R1310 Dysphagia, unspecified: Secondary | ICD-10-CM | POA: Insufficient documentation

## 2015-02-05 DIAGNOSIS — H9202 Otalgia, left ear: Secondary | ICD-10-CM | POA: Insufficient documentation

## 2015-02-05 DIAGNOSIS — K029 Dental caries, unspecified: Secondary | ICD-10-CM | POA: Insufficient documentation

## 2015-02-05 DIAGNOSIS — K047 Periapical abscess without sinus: Secondary | ICD-10-CM | POA: Insufficient documentation

## 2015-02-05 MED ORDER — NAPROXEN 500 MG PO TABS: 500.0000 mg | ORAL_TABLET | Freq: Two times a day (BID) | ORAL | Status: DC

## 2015-02-05 MED ORDER — AMOXICILLIN 500 MG PO CAPS: 500.0000 mg | ORAL_CAPSULE | Freq: Three times a day (TID) | ORAL | Status: DC

## 2015-02-05 MED ORDER — AMOXICILLIN 500 MG PO CAPS
500.0000 mg | ORAL_CAPSULE | Freq: Once | ORAL | Status: AC
  Administered 2015-02-05: 500 mg via ORAL
  Filled 2015-02-05: qty 1

## 2015-02-05 MED ORDER — HYDROCODONE-ACETAMINOPHEN 5-325 MG PO TABS
1.0000 | ORAL_TABLET | Freq: Once | ORAL | Status: AC
  Administered 2015-02-05: 1 via ORAL
  Filled 2015-02-05: qty 1

## 2015-02-05 MED ORDER — HYDROCODONE-ACETAMINOPHEN 5-325 MG PO TABS: ORAL_TABLET | ORAL | Status: DC

## 2015-02-05 NOTE — ED Provider Notes (Signed)
CSN: [id number]     Arrival date & time 02/05/15  1204 History   First MD Initiated Contact with Patient 02/05/15 1228     Chief Complaint  Patient presents with  . Facial Pain     (Consider location/radiation/quality/duration/timing/severity/associated sxs/prior Treatment) HPI Comments: Patient presents with one week of left-sided facial pain with radiation to her ear. Patient also complains of sore throat and difficulty swallowing. She has not had any fevers, nausea, or vomiting. She states that it hurts to open her mouth widely and hurts to chew food. She has been able to tolerate fluids. No difficulty breathing or shortness of breath. No pain with movement of her head or neck. Symptoms started 1 week ago as a left-sided toothache on the bottom jaw. No history of diabetes or immunocompromise. She has taken Tylenol which helps her sleep. The onset of this condition was acute. The course is constant.     The history is provided by the patient.    Past Medical History  Diagnosis Date  . No pertinent past medical history    Past Surgical History  Procedure Laterality Date  . Tonsillectomy     Family History  Problem Relation Age of Onset  . Other Neg Hx    Social History  Substance Use Topics  . Smoking status: Current Every Day Smoker -- 0.50 packs/day    Types: Cigarettes  . Smokeless tobacco: Never Used  . Alcohol Use: No   OB History    Gravida Para Term Preterm AB TAB SAB Ectopic Multiple Living   0              Review of Systems  Constitutional: Negative for fever.  HENT: Positive for dental problem, ear pain, facial swelling and trouble swallowing (Able to swallow, pain with swallowing). Negative for sore throat.   Respiratory: Negative for shortness of breath and stridor.   Musculoskeletal: Negative for neck pain.  Skin: Negative for color change.  Neurological: Negative for headaches.      Allergies  Review of patient's allergies indicates no known  allergies.  Home Medications   Prior to Admission medications   Medication Sig Start Date End Date Taking? Authorizing Provider  amoxicillin (AMOXIL) 500 MG capsule Take 1 capsule (500 mg total) by mouth 3 (three) times daily. 02/05/15   Renne Crigler, PA-C  HYDROcodone-acetaminophen (NORCO/VICODIN) 5-325 MG per tablet Take 1-2 tablets every 6 hours as needed for severe pain 02/05/15   Renne Crigler, PA-C  naproxen (NAPROSYN) 500 MG tablet Take 1 tablet (500 mg total) by mouth 2 (two) times daily. 02/05/15   Renne Crigler, PA-C   BP 137/88 mmHg  Pulse 110  Temp(Src) 98.8 F (37.1 C) (Oral)  Resp 18  Ht 5\' 2"  (1.575 m)  Wt 115 lb (52.164 kg)  BMI 21.03 kg/m2  SpO2 100%  LMP 01/30/2015   Physical Exam  Constitutional: She appears well-developed and well-nourished.  HENT:  Head: Normocephalic and atraumatic.  Right Ear: Tympanic membrane, external ear and ear canal normal.  Left Ear: Tympanic membrane, external ear and ear canal normal.  Nose: Nose normal.  Mouth/Throat: Uvula is midline, oropharynx is clear and moist and mucous membranes are normal. No trismus in the jaw. Abnormal dentition. Dental caries present. No dental abscesses or uvula swelling. No tonsillar abscesses.  Patient with external tenderness inferior to ear and inferiorly over the angle of the jaw and tonsillar area. There is no significant tenderness externally of the neck. Patient does have tenderness of  the gums and erythema suggestive of dental infection around the left mandibular molars. No gross abscess palpated. No external erythema. No appreciable facial swelling. No swelling or woody texture underneath tongue. Mild trismus.  Eyes: Conjunctivae are normal.  Neck: Normal range of motion. Neck supple.  No neck swelling or Ludwig's angina  Lymphadenopathy:    She has no cervical adenopathy.  Neurological: She is alert.  Skin: Skin is warm and dry.  Psychiatric: She has a normal mood and affect.  Nursing note  and vitals reviewed.   ED Course  Procedures (including critical care time) Labs Review Labs Reviewed - No data to display  Imaging Review No results found. I have personally reviewed and evaluated these images and lab results as part of my medical decision-making.   EKG Interpretation None       1:10 PM Patient seen and examined. Medications ordered.   Vital signs reviewed and are as follows: BP 137/88 mmHg  Pulse 110  Temp(Src) 98.8 F (37.1 C) (Oral)  Resp 18  Ht  (1.575 m)  Wt 115 lb (52.164 kg)  BMI 21.03 kg/m2  SpO2 100%  LMP 01/30/2015   Had a long discussion with the patient and her mother regarding how to proceed. Patient at this point looks very well, is afebrile, nontoxic. She does not have any external signs of a deep space infection in her neck or Ludwig's angina. We discussed that symptoms could worsen. I offered treatment with antibodies and return in 24-48 hours for recheck versus CT now. I do not feel strongly about obtaining a CT at this time. However, patient needs to return immediately if she develops worsening swelling of her face or neck, is unable to swallow, has pressure in her neck, or fevers. Mother and patient are willing to forego imaging at this time and follow return precautions discussed. They seem reliable to return if symptoms do indeed worsen.  Patient counseled on use of narcotic pain medications. Counseled not to combine these medications with others containing tylenol. Urged not to drink alcohol, drive, or perform any other activities that requires focus while taking these medications. The patient verbalizes understanding and agrees with the plan.  Patient counseled to take prescribed medications as directed, return with worsening facial or neck swelling, and to follow-up with their dentist as soon as possible.    MDM   Final diagnoses:  Dental infection   Patient with suspected dental infection. Will treat with amoxicillin. No hard  signs of a deep space infection or abscess of the neck. No external signs of Ludwig's angina. No history of diabetes or immunocompromise which would make her high risk for these. Discussed return precautions as above. Patient appears well, nontoxic. She does not have a fever.    Renne Crigler, PA-C 02/05/15 1313  Mirian Mo, MD 02/06/15 [phone]

## 2015-02-05 NOTE — ED Notes (Signed)
C/o knot that is tender on right side. C/o right ear pain. No other sx.

## 2015-02-05 NOTE — Discharge Instructions (Signed)
Please read and follow all provided instructions.  Your diagnoses today include:  1. Dental infection     The exam and treatment you received today has been provided on an emergency basis only. This is not a substitute for complete medical or dental care.  Tests performed today include:  Vital signs. See below for your results today.   Medications prescribed:   Amoxicillin - antibiotic  You have been prescribed an antibiotic medicine: take the entire course of medicine even if you are feeling better. Stopping early can cause the antibiotic not to work.   Vicodin (hydrocodone/acetaminophen) - narcotic pain medication  DO NOT drive or perform any activities that require you to be awake and alert because this medicine can make you drowsy. BE VERY CAREFUL not to take multiple medicines containing Tylenol (also called acetaminophen). Doing so can lead to an overdose which can damage your liver and cause liver failure and possibly death.   Naproxen - anti-inflammatory pain medication  Do not exceed  naproxen every 12 hours, take with food  You have been prescribed an anti-inflammatory medication or NSAID. Take with food. Take smallest effective dose for the shortest duration needed for your pain. Stop taking if you experience stomach pain or vomiting.   Take any prescribed medications only as directed.  Home care instructions:  Follow any educational materials contained in this packet.  Follow-up instructions: Please follow-up with your dentist for further evaluation of your symptoms.   Dental Assistance: See below for dental referrals  Return instructions:   Please return to the Emergency Department if you experience worsening symptoms.  Please return if you develop a fever, you develop more swelling in your face or neck, you have trouble breathing or swallowing food.  Please return if you have any other emergent concerns.  Additional Information:  Your vital signs  today were: BP 137/88 mmHg   Pulse 110   Temp(Src) 98.8 F (37.1 C) (Oral)   Resp 18   Ht  (1.575 m)   Wt 115 lb (52.164 kg)   BMI 21.03 kg/m2   SpO2 100%   LMP 01/30/2015 If your blood pressure (BP) was elevated above 135/85 this visit, please have this repeated by your doctor within one month. -------------- Dental Care: Organization         Address  Phone  Notes  [facility] Department of [facility] [address] [place], [place] [phone] Accepts children up to age 71 who are enrolled in [place] or [place] Health Choice; pregnant women with a Medicaid card; and children who have applied for Medicaid or [place] Health Choice, but were declined, whose parents can pay a reduced fee at time of service.  [facility] Department of [facility]  [address], [place] [phone] Accepts children up to age 89 who are enrolled in [place] or [place] Health Choice; pregnant women with a Medicaid card; and children who have applied for Medicaid or [place] Health Choice, but were declined, whose parents can pay a reduced fee at time of service.  [facility]  [address] [place], [place] [phone] Patients are seen by appointment only. Walk-ins are not accepted. [facility] will see patients 65 years of age and older. Monday - Tuesday (8am-5pm) Most Wednesdays (8:30-5pm) $30 per visit, cash only  [facility]  [address], [facility] [phone] Patients are seen by appointment only. Walk-ins are  not accepted. [facility] will see patients 47 years of age and older. One Wednesday Evening (Monthly: Volunteer Based).  $30 per visit, cash only  [employer]  [phone] for adults; Children under age 62, call Graduate Pediatric Dentistry at [phone]. Children aged 73-14, please call [phone] to request a pediatric application.  Dental services are  provided in all areas of dental care including fillings, crowns and bridges, complete and partial dentures, implants, gum treatment, root canals, and extractions. Preventive care is also provided. Treatment is provided to both adults and children. Patients are selected via a lottery and there is often a waiting list.   [facility] [address] [place]  [phone] [email or web address]   [facility] [address] [place], [place] [phone], Ext. [phone] Second and Fourth Thursday of each month, opens at 6:30 AM; Clinic ends at 9 AM.  Patients are seen on a first-come first-served basis, and a limited number are seen during each clinic.   [facility]  [address] [place], [place] [phone]   Eligibility Requirements You must have lived in [place], [place], or [place] counties for at least the last three months.   You cannot be eligible for state or federal sponsored [place], including [employer], [place], or [employer].   You generally cannot be eligible for healthcare insurance through your employer.    How to apply: Eligibility screenings are held every Tuesday and Wednesday afternoon from 1:00 pm until 4:00 pm. You do not need an appointment for the interview!  [facility] [address], [place], [place] [phone]   [facility] Health Department  [phone]   [facility]  [phone]   [facility] Health Department  [phone]

## 2016-04-08 ENCOUNTER — Encounter (HOSPITAL_COMMUNITY): Payer: Self-pay | Admitting: *Deleted

## 2016-04-08 ENCOUNTER — Emergency Department (HOSPITAL_COMMUNITY)
Admission: EM | Admit: 2016-04-08 | Discharge: 2016-04-08 | Disposition: A | Discharge status: Home / Self Care | Payer: Self-pay | Attending: Emergency Medicine | Admitting: Emergency Medicine

## 2016-04-08 DIAGNOSIS — F17210 Nicotine dependence, cigarettes, uncomplicated: Secondary | ICD-10-CM | POA: Insufficient documentation

## 2016-04-08 DIAGNOSIS — R0981 Nasal congestion: Secondary | ICD-10-CM

## 2016-04-08 DIAGNOSIS — J069 Acute upper respiratory infection, unspecified: Secondary | ICD-10-CM | POA: Insufficient documentation

## 2016-04-08 NOTE — ED Notes (Signed)
See provider notes for assessment 

## 2016-04-08 NOTE — Discharge Instructions (Signed)
Take Mucinex DM as prescribed on the packaging, take Zyrtec at night, take Afrin as prescribed on the packaging but only for 3 days and be sure to drink plenty of fluids. Follow-up with the [facility] in 3 days if your symptoms are not improving. Return immediately to the emergency department if you experience worsening sore throat,  throat swelling, worsening fevers, difficulty swallowing, difficulty breathing or any other concerning symptoms.

## 2016-04-08 NOTE — ED Provider Notes (Signed)
[facility]-EMER[facility]Y DEPT Provider Note   CSN: [id number] Arrival date & time: 04/08/16  1548  By signing my name below, I, Linna Darner, attest that this documentation has been prepared under the direction and in the presence of Mattie Marlin, PA-C. Electronically Signed: Linna Darner, Scribe. 04/08/2016. 4:48 PM.  History   Chief Complaint Chief Complaint  Patient presents with  . Cough  . Fever  . Headache    The history is provided by the patient. No language interpreter was used.    HPI Comments: Alicia Rodriguez is a 25 y.o. female who presents to the Emergency Department complaining of gradual onset, constant, flu-like symptoms beginning 3 days ago. She endorses nasal congestion, rhinorrhea, postnasal drip, frontal headache, generalized myalgias, sore throat, and productive cough with undescribed mucous. Pt reports intermittent fever/chills since onset; she notes she has not measured her temperature since onset. She reports sore throat exacerbation with swallowing and headache exacerbation with leaning forward/bending over. She states her symptoms are worse at night. Pt notes her aunt recently experienced similar symptoms. She states the last time she experienced similar symptoms was many years ago when she was a child. She has tried Mucinex with no relief of her symptoms. She notes she smokes cigarettes "every now and then." Pt denies ear pain, ear discharge, abdominal pain, nausea, vomiting, CP, wheezing, SOB, rash, or any other associated symptoms.  Past Medical History:  Diagnosis Date  . No pertinent past medical history     There are no active problems to display for this patient.   Past Surgical History:  Procedure Laterality Date  . TONSILLECTOMY      OB History    Gravida Para Term Preterm AB Living   0             SAB TAB Ectopic Multiple Live Births                   Home Medications    Prior to Admission medications   Medication Sig Start Date End  Date Taking? Authorizing Provider  amoxicillin (AMOXIL) 500 MG capsule Take 1 capsule (500 mg total) by mouth 3 (three) times daily. 02/05/15   Renne Crigler, PA-C  HYDROcodone-acetaminophen (NORCO/VICODIN) 5-325 MG per tablet Take 1-2 tablets every 6 hours as needed for severe pain 02/05/15   Renne Crigler, PA-C  naproxen (NAPROSYN) 500 MG tablet Take 1 tablet (500 mg total) by mouth 2 (two) times daily. 02/05/15   Renne Crigler, PA-C    Family History Family History  Problem Relation Age of Onset  . Other Neg Hx     Social History Social History  Substance Use Topics  . Smoking status: Current Every Day Smoker    Packs/day: 0.50    Types: Cigarettes  . Smokeless tobacco: Never Used  . Alcohol use Yes     Comment: occ     Allergies   Review of patient's allergies indicates no known allergies.   Review of Systems Review of Systems  Constitutional: Positive for chills and fever.  HENT: Positive for congestion, postnasal drip, rhinorrhea and sore throat. Negative for ear discharge and ear pain.   Respiratory: Positive for cough (productive). Negative for shortness of breath and wheezing.   Cardiovascular: Negative for chest pain.  Gastrointestinal: Negative for abdominal pain, nausea and vomiting.  Musculoskeletal: Positive for myalgias (generalized).  Skin: Negative for rash.  Neurological: Positive for headaches.    Physical Exam Updated Vital Signs BP 120/75 (BP Location: Right Arm)  Pulse 98   Temp 98.6 F (37 C) (Oral)   Resp 18   LMP 03/11/2016   SpO2 100%   Physical Exam  Constitutional: She appears well-developed and well-nourished. No distress.  HENT:  Head: Normocephalic and atraumatic.  Nose: Mucosal edema and rhinorrhea present.  Mouth/Throat: Posterior oropharyngeal erythema present. No oropharyngeal exudate or posterior oropharyngeal edema.  Eyes: Conjunctivae are normal. Pupils are equal, round, and reactive to light.  Cardiovascular: Normal rate,  regular rhythm, normal heart sounds and intact distal pulses.   Pulmonary/Chest: Effort normal and breath sounds normal. No respiratory distress.  Lungs CTA.  Abdominal: Soft. There is no tenderness.  Musculoskeletal: Normal range of motion.  Lymphadenopathy:    She has no cervical adenopathy.  Neurological: She is alert. Coordination normal.  Skin: Skin is warm and dry. She is not diaphoretic.  Psychiatric: She has a normal mood and affect. Her behavior is normal.  Nursing note and vitals reviewed.   ED Treatments / Results  Labs (all labs ordered are listed, but only abnormal results are displayed) Labs Reviewed - No data to display  EKG  EKG Interpretation None       Radiology No results found.  Procedures Procedures (including critical care time)  DIAGNOSTIC STUDIES: Oxygen Saturation is 100% on RA, normal by my interpretation.    COORDINATION OF CARE: 4:57 PM Discussed treatment plan with pt at bedside and pt agreed to plan.  Medications Ordered in ED Medications - No data to display   Initial Impression / Assessment and Plan / ED Course  I have reviewed the triage vital signs and the nursing notes.  Pertinent labs & imaging results that were available during my care of the patient were reviewed by me and considered in my medical decision making (see chart for details).  Clinical Course   Patients symptoms are consistent with URI, likely viral etiology. Patient afebrile, VSS, well-appearing. Discussed that antibiotics are not indicated for viral infections. Pt will be discharged with symptomatic treatment.  Verbalizes understanding and is agreeable with plan. Instructed the patient to follow up with the [facility] in 3 days if symptoms are not improving. Discussed strict return precautions to the ED. Patient was understanding the discharge instructions. Pt is hemodynamically stable & in NAD prior to dc.  I personally  performed the services described in this documentation, which was scribed in my presence. The recorded information has been reviewed and is accurate.   Final Clinical Impressions(s) / ED Diagnoses   Final diagnoses:  Upper respiratory tract infection, unspecified type  Sinus congestion    New Prescriptions New Prescriptions   No medications on file     Jerre SimonJessica L Ravina Milner, [place] 04/08/16 1705    Glynn OctaveStephen Rancour, MD 04/08/16 2326

## 2016-04-08 NOTE — ED Triage Notes (Signed)
Pt is here with cough, headache, sore throat, bodyaches, and fever.  No fever now.

## 2016-06-28 ENCOUNTER — Encounter (HOSPITAL_BASED_OUTPATIENT_CLINIC_OR_DEPARTMENT_OTHER): Payer: Self-pay | Admitting: Emergency Medicine

## 2016-06-28 ENCOUNTER — Emergency Department (HOSPITAL_BASED_OUTPATIENT_CLINIC_OR_DEPARTMENT_OTHER)
Admission: EM | Admit: 2016-06-28 | Discharge: 2016-06-28 | Disposition: A | Discharge status: Home / Self Care | Payer: Self-pay | Attending: Emergency Medicine | Admitting: Emergency Medicine

## 2016-06-28 ENCOUNTER — Emergency Department (HOSPITAL_BASED_OUTPATIENT_CLINIC_OR_DEPARTMENT_OTHER): Payer: Self-pay

## 2016-06-28 DIAGNOSIS — R05 Cough: Secondary | ICD-10-CM | POA: Insufficient documentation

## 2016-06-28 DIAGNOSIS — R42 Dizziness and giddiness: Secondary | ICD-10-CM | POA: Insufficient documentation

## 2016-06-28 DIAGNOSIS — R519 Headache, unspecified: Secondary | ICD-10-CM

## 2016-06-28 DIAGNOSIS — R059 Cough, unspecified: Secondary | ICD-10-CM

## 2016-06-28 DIAGNOSIS — J3489 Other specified disorders of nose and nasal sinuses: Secondary | ICD-10-CM | POA: Insufficient documentation

## 2016-06-28 DIAGNOSIS — F17210 Nicotine dependence, cigarettes, uncomplicated: Secondary | ICD-10-CM | POA: Insufficient documentation

## 2016-06-28 DIAGNOSIS — R509 Fever, unspecified: Secondary | ICD-10-CM | POA: Insufficient documentation

## 2016-06-28 DIAGNOSIS — R51 Headache: Secondary | ICD-10-CM | POA: Insufficient documentation

## 2016-06-28 LAB — CBC WITH DIFFERENTIAL/PLATELET
BASOS ABS: 0 10*3/uL (ref 0.0–0.1)
BASOS PCT: 0 %
EOS ABS: 0.3 10*3/uL (ref 0.0–0.7)
Eosinophils Relative: 2 %
HCT: 41.7 % (ref 36.0–46.0)
HEMOGLOBIN: 15.4 g/dL — AB (ref 12.0–15.0)
Lymphocytes Relative: 34 %
Lymphs Abs: 5.4 10*3/uL — ABNORMAL HIGH (ref 0.7–4.0)
MCH: 30.2 pg (ref 26.0–34.0)
MCHC: 36.9 g/dL — AB (ref 30.0–36.0)
MCV: 81.8 fL (ref 78.0–100.0)
MONO ABS: 1.3 10*3/uL — AB (ref 0.1–1.0)
Monocytes Relative: 8 %
NEUTROS ABS: 9 10*3/uL — AB (ref 1.7–7.7)
NEUTROS PCT: 56 %
Platelets: 340 10*3/uL (ref 150–400)
RBC: 5.1 MIL/uL (ref 3.87–5.11)
RDW: 13.4 % (ref 11.5–15.5)
WBC: 16 10*3/uL — ABNORMAL HIGH (ref 4.0–10.5)

## 2016-06-28 LAB — URINALYSIS, ROUTINE W REFLEX MICROSCOPIC
BILIRUBIN URINE: NEGATIVE
Glucose, UA: NEGATIVE mg/dL
HGB URINE DIPSTICK: NEGATIVE
Ketones, ur: NEGATIVE mg/dL
Nitrite: NEGATIVE
Protein, ur: NEGATIVE mg/dL
Specific Gravity, Urine: 1.024 (ref 1.005–1.030)
pH: 6 (ref 5.0–8.0)

## 2016-06-28 LAB — BASIC METABOLIC PANEL
ANION GAP: 9 (ref 5–15)
BUN: 14 mg/dL (ref 6–20)
CALCIUM: 9.4 mg/dL (ref 8.9–10.3)
CO2: 22 mmol/L (ref 22–32)
CREATININE: 0.66 mg/dL (ref 0.44–1.00)
Chloride: 103 mmol/L (ref 101–111)
GFR calc non Af Amer: >60 mL/min (ref >60)
Glucose, Bld: 84 mg/dL (ref 65–99)
Potassium: 3.7 mmol/L (ref 3.5–5.1)
SODIUM: 134 mmol/L — AB (ref 135–145)

## 2016-06-28 LAB — URINALYSIS, MICROSCOPIC (REFLEX): RBC / HPF: NONE SEEN RBC/hpf (ref 0–5)

## 2016-06-28 LAB — PREGNANCY, URINE: PREG TEST UR: NEGATIVE

## 2016-06-28 LAB — CBG MONITORING, ED: Glucose-Capillary: 130 mg/dL — ABNORMAL HIGH (ref 65–99)

## 2016-06-28 MED ORDER — SODIUM CHLORIDE 0.9 % IV BOLUS (SEPSIS)
1000.0000 mL | Freq: Once | INTRAVENOUS | Status: AC
  Administered 2016-06-28: 1000 mL via INTRAVENOUS

## 2016-06-28 MED ORDER — KETOROLAC TROMETHAMINE 30 MG/ML IJ SOLN
30.0000 mg | Freq: Once | INTRAMUSCULAR | Status: AC
  Administered 2016-06-28: 30 mg via INTRAVENOUS
  Filled 2016-06-28: qty 1

## 2016-06-28 MED ORDER — DEXAMETHASONE SODIUM PHOSPHATE 10 MG/ML IJ SOLN
10.0000 mg | Freq: Once | INTRAMUSCULAR | Status: AC
  Administered 2016-06-28: 10 mg via INTRAVENOUS
  Filled 2016-06-28: qty 1

## 2016-06-28 MED ORDER — METOCLOPRAMIDE HCL 5 MG/ML IJ SOLN
10.0000 mg | Freq: Once | INTRAMUSCULAR | Status: AC
  Administered 2016-06-28: 10 mg via INTRAVENOUS
  Filled 2016-06-28: qty 2

## 2016-06-28 MED ORDER — DIPHENHYDRAMINE HCL 50 MG/ML IJ SOLN
25.0000 mg | Freq: Once | INTRAMUSCULAR | Status: AC
  Administered 2016-06-28: 25 mg via INTRAVENOUS
  Filled 2016-06-28: qty 1

## 2016-06-28 NOTE — ED Triage Notes (Signed)
Pt c/o headache, generalized weakness and frequency in urination since Thursday.

## 2016-06-28 NOTE — ED Notes (Addendum)
Patient denies pain and is resting comfortably, at dischage

## 2016-06-28 NOTE — ED Provider Notes (Signed)
[facility]   CSN: [id number] Arrival date & time: 06/28/16  1439  By signing my name below, I, Javier Docker, attest that this documentation has been prepared under the direction and in the presence of Cheri Fowler, PA-C. Electronically Signed: Javier Docker, ER Scribe. 01/25/2016. 4:27 PM.  History   Chief Complaint Chief Complaint  Patient presents with  . Headache  . Urinary Frequency   The history is provided by the patient and a parent. No language interpreter was used.    HPI Comments: Alicia Rodriguez is a 26 y.o. female who presents to the Emergency Department complaining of two days of gradually worsening bilateral sharp throbbing HA with associated light headedness on standing and photophobia. Sleeping makes her pain better and standing up suddenly makes it worse. She also states she has had one week of rhinorrhea, cough, and had a fever (102) three days ago. No fevers since then, and states her rhinorrhea and cough are improving.  She denies numbness, weakness, abdominal pain, dysuria, nausea or vomiting, urinary symptoms, AMS, confusion, neck stiffness, or back pain. She has no PMHx of migraine. Family hx of migraine.  She has no family hx of seizure. She has taken tylenol without relief. No family history of brain aneurysm.  She does not take any regular medications. She does not drink caffeine daily. She denies head injury, neck pain, or back pain.   Past Medical History:  Diagnosis Date  . No pertinent past medical history     There are no active problems to display for this patient.   Past Surgical History:  Procedure Laterality Date  . TONSILLECTOMY      OB History    Gravida Para Term Preterm AB Living   0             SAB TAB Ectopic Multiple Live Births                   Home Medications    Prior to Admission medications   Medication Sig Start Date End Date Taking? Authorizing Provider  amoxicillin (AMOXIL) 500 MG  capsule Take 1 capsule (500 mg total) by mouth 3 (three) times daily. 02/05/15   Renne Crigler, PA-C  HYDROcodone-acetaminophen (NORCO/VICODIN) 5-325 MG per tablet Take 1-2 tablets every 6 hours as needed for severe pain 02/05/15   Renne Crigler, PA-C  naproxen (NAPROSYN) 500 MG tablet Take 1 tablet (500 mg total) by mouth 2 (two) times daily. 02/05/15   Renne Crigler, PA-C    Family History Family History  Problem Relation Age of Onset  . Other Neg Hx     Social History Social History  Substance Use Topics  . Smoking status: Current Every Day Smoker    Packs/day: 0.50    Types: Cigarettes  . Smokeless tobacco: Never Used  . Alcohol use Yes     Comment: occ     Allergies   Patient has no known allergies.   Review of Systems Review of Systems  Constitutional: Negative for chills.  HENT: Positive for rhinorrhea.   Eyes: Positive for photophobia.  Genitourinary: Negative for dysuria.  Musculoskeletal: Negative for back pain.  Neurological: Positive for light-headedness. Negative for weakness and numbness.  A complete 10 system review of systems was obtained and all systems are negative except as noted in the HPI and PMH.    Physical Exam Updated Vital Signs BP 121/83 (BP Location: Left Arm)   Pulse 86   Temp 98.2  F (36.8 C) (Oral)   Resp 14   Ht 5\' 2"  (1.575 m)   Wt 63.5 kg   LMP 05/28/2016 (Exact Date)   SpO2 100%   BMI 25.61 kg/m   Physical Exam  Constitutional: She is oriented to person, place, and time. She appears well-developed and well-nourished.  Non-toxic appearance. She does not have a sickly appearance. She does not appear ill. No distress.  HENT:  Head: Normocephalic and atraumatic.  Mouth/Throat: Oropharynx is clear and moist.  Eyes: Conjunctivae are normal. Pupils are equal, round, and reactive to light.  Neck: Normal range of motion. Neck supple.  No nuchal rigidity.  Cardiovascular: Normal rate and regular rhythm.   Pulmonary/Chest: Effort  normal and breath sounds normal. No accessory muscle usage or stridor. No respiratory distress. She has no wheezes. She has no rhonchi. She has no rales.  Abdominal: Soft. Bowel sounds are normal. She exhibits no distension. There is no tenderness.  Musculoskeletal: Normal range of motion.  Lymphadenopathy:    She has no cervical adenopathy.  Neurological: She is alert and oriented to person, place, and time. Coordination normal.  Mental Status:   AOx3.  Speech clear without dysarthria. Cranial Nerves:  I-not tested  II-PERRLA  III, IV, VI-EOMs intact  V-temporal and masseter strength intact  VII-symmetrical facial movements intact, no facial droop  VIII-hearing grossly intact bilaterally  IX, X-gag intact  XI-strength of sternomastoid and trapezius muscles 5/5  XII-tongue midline Motor:   Good muscle bulk and tone  Strength 5/5 bilaterally in upper and lower extremities   Cerebellar--intact RAMs, finger to nose intact bilaterally.  Gait normal  No pronator drift Sensory:  Intact in upper and lower extremities  Skin: Skin is warm and dry. She is not diaphoretic.  Psychiatric: She has a normal mood and affect. Her behavior is normal.  Nursing note and vitals reviewed.    ED Treatments / Results  DIAGNOSTIC STUDIES: Oxygen Saturation is 100% on RA, normal by my interpretation.    COORDINATION OF CARE: 4:20 PM Discussed treatment plan with pt at bedside and pt agreed to plan.  Labs (all labs ordered are listed, but only abnormal results are displayed) Labs Reviewed  URINALYSIS, ROUTINE W REFLEX MICROSCOPIC - Abnormal; Notable for the following:       Result Value   Leukocytes, UA TRACE (*)    All other components within normal limits  URINALYSIS, MICROSCOPIC (REFLEX) - Abnormal; Notable for the following:    Bacteria, UA MANY (*)    Squamous Epithelial / LPF 0-5 (*)    All other components within normal limits  CBC WITH DIFFERENTIAL/PLATELET - Abnormal; Notable for the  following:    WBC 16.0 (*)    Hemoglobin 15.4 (*)    MCHC 36.9 (*)    Neutro Abs 9.0 (*)    Lymphs Abs 5.4 (*)    Monocytes Absolute 1.3 (*)    All other components within normal limits  BASIC METABOLIC PANEL - Abnormal; Notable for the following:    Sodium 134 (*)    All other components within normal limits  CBG MONITORING, ED - Abnormal; Notable for the following:    Glucose-Capillary 130 (*)    All other components within normal limits  URINE CULTURE  PREGNANCY, URINE    EKG  EKG Interpretation None       Radiology Ct Head Wo Contrast  Result Date: 06/28/2016 CLINICAL DATA:  Two days of gradually worsening BILATERAL sharp throbbing headache with associated lightheadedness with  standing and photophobia, 1 week of rhinorrhea with fever 3 days ago, smoker EXAM: CT HEAD WITHOUT CONTRAST TECHNIQUE: Contiguous axial images were obtained from the base of the skull through the vertex without intravenous contrast. Sagittal and coronal MPR images reconstructed from axial data set. COMPARISON:  08/12/2008 FINDINGS: Brain: Normal ventricular morphology. No midline shift or mass effect. Normal appearance of brain parenchyma. No intracranial hemorrhage, mass lesion, evidence of acute infarction, or extra-axial fluid collection. Vascular: Unremarkable Skull: Intact Sinuses/Orbits: Clear Other: N/A IMPRESSION: Normal exam. Electronically Signed   By: Ulyses SouthwardMark  Boles M.D.   On: 06/28/2016 17:44    Procedures Procedures (including critical care time)  Medications Ordered in ED Medications  sodium chloride 0.9 % bolus 1,000 mL (0 mLs Intravenous Stopped 06/28/16 1743)  metoCLOPramide (REGLAN) injection 10 mg (10 mg Intravenous Given 06/28/16 1701)  diphenhydrAMINE (BENADRYL) injection 25 mg (25 mg Intravenous Given 06/28/16 1654)  dexamethasone (DECADRON) injection 10 mg (10 mg Intravenous Given 06/28/16 1659)  ketorolac (TORADOL) 30 MG/ML injection 30 mg (30 mg Intravenous Given 06/28/16 1657)      Initial Impression / Assessment and Plan / ED Course  I have reviewed the triage vital signs and the nursing notes.  Pertinent labs & imaging results that were available during my care of the patient were reviewed by me and considered in my medical decision making (see chart for details).  Clinical Course    Patient presents for evaluation of 4 days of headache. The beginning, she states she did have a fever with some rhinorrhea, and cough. She states the symptoms have improved, but her headache has persisted. She denies any neck pain, neck stiffness, back pain, altered mental status, photophobia. No nausea or vomiting. No head injury or trauma. No family history of cerebral aneurysm. On exam, patient appears well, nontoxic or ill. Vitals reassuring. No focal neurological deficits on exam. No meningismus. CT obtained due to no similar prior headaches, this was normal.  UA without infection. She is not pregnant. No metabolic derangement. She does have a leukocytosis of 16, however this is nonspecific and could be related to her recent cold. Low clinical suspicion for meningitis. Patient feels improved after migraine cocktail and fluids. Instructed patient on symptomatic treatment. Follow up with PCP. Return precautions discussed. Stable for discharge.  Final Clinical Impressions(s) / ED Diagnoses   Final diagnoses:  Nonintractable headache, unspecified chronicity pattern, unspecified headache type  Cough    New Prescriptions New Prescriptions   No medications on file     I personally performed the services described in this documentation, which was scribed in my presence. The recorded information has been reviewed and is accurate.        Cheri FowlerKayla Janard Culp, PA-C 06/28/16 1901    Cheri FowlerKayla Louanna Vanliew, PA-C 06/28/16 1901    Doug SouSam Jacubowitz, MD 06/28/16 [phone]

## 2016-06-28 NOTE — Discharge Instructions (Signed)
Your labs and CT today are normal.  You likely have a cold that is causing your symptoms.  Take 800 mg ibuprofen and 1000 mg Tylenol every 6 hours for headache.  Follow up with your primary care physician in the next couple of days.  Return to the ED for sudden worsening headache, persistent vomiting, numbness, weakness, or any new or concerning symptoms.

## 2016-06-30 LAB — URINE CULTURE

## 2017-05-10 ENCOUNTER — Emergency Department (HOSPITAL_BASED_OUTPATIENT_CLINIC_OR_DEPARTMENT_OTHER)
Admission: EM | Admit: 2017-05-10 | Discharge: 2017-05-10 | Disposition: A | Discharge status: Home / Self Care | Payer: Self-pay | Attending: Emergency Medicine | Admitting: Emergency Medicine

## 2017-05-10 ENCOUNTER — Encounter (HOSPITAL_BASED_OUTPATIENT_CLINIC_OR_DEPARTMENT_OTHER): Payer: Self-pay

## 2017-05-10 ENCOUNTER — Other Ambulatory Visit: Payer: Self-pay

## 2017-05-10 DIAGNOSIS — L0291 Cutaneous abscess, unspecified: Secondary | ICD-10-CM

## 2017-05-10 DIAGNOSIS — Z23 Encounter for immunization: Secondary | ICD-10-CM | POA: Insufficient documentation

## 2017-05-10 DIAGNOSIS — N764 Abscess of vulva: Secondary | ICD-10-CM | POA: Insufficient documentation

## 2017-05-10 DIAGNOSIS — F17210 Nicotine dependence, cigarettes, uncomplicated: Secondary | ICD-10-CM | POA: Insufficient documentation

## 2017-05-10 MED ORDER — DOXYCYCLINE HYCLATE 100 MG PO CAPS: 100.0000 mg | ORAL_CAPSULE | Freq: Two times a day (BID) | ORAL | 0 refills | Status: AC

## 2017-05-10 MED ORDER — LIDOCAINE HCL 2 % IJ SOLN
10.0000 mL | Freq: Once | INTRAMUSCULAR | Status: AC
  Administered 2017-05-10: 200 mg
  Filled 2017-05-10: qty 20

## 2017-05-10 MED ORDER — TETANUS-DIPHTH-ACELL PERTUSSIS 5-2.5-18.5 LF-MCG/0.5 IM SUSP
0.5000 mL | Freq: Once | INTRAMUSCULAR | Status: AC
  Administered 2017-05-10: 0.5 mL via INTRAMUSCULAR
  Filled 2017-05-10: qty 0.5

## 2017-05-10 NOTE — ED Provider Notes (Signed)
[facility] EMERGENCY DEPARTMENT Provider Note   CSN: [id number] Arrival date & time: 05/10/17  1646     History   Chief Complaint Chief Complaint  Patient presents with  . Abscess    HPI Alicia Rodriguez is a 26 y.o. female presenting with pain of left outer labia.  Patient states that for the past 4 days, she has had pain of her left outer labia.  It started as a small bump, has steadily grew and become more painful.  She denies fevers, chills, nausea, or vomiting.  She denies urinary symptoms or vaginal discharge.  It is constant, nothing makes the pain better or worse.  She has not taken any medicine including Tylenol or ibuprofen.  She denies any drainage from the area.  She reports history of boils on her abdomen that required surgery.  She does not know when her last tetanus shot was, does not think it was within the past 10 years.  She has no other medical problems, does not take medications daily including blood thinners.   HPI  Past Medical History:  Diagnosis Date  . No pertinent past medical history     There are no active problems to display for this patient.   Past Surgical History:  Procedure Laterality Date  . TONSILLECTOMY      OB History    Gravida Para Term Preterm AB Living   0             SAB TAB Ectopic Multiple Live Births                   Home Medications    Prior to Admission medications   Medication Sig Start Date End Date Taking? Authorizing Provider  doxycycline (VIBRAMYCIN) 100 MG capsule Take 1 capsule (100 mg total) by mouth 2 (two) times daily for 10 days. 05/10/17 05/20/17  Attila Mccarthy, PA-C    Family History Family History  Problem Relation Age of Onset  . Other Neg Hx     Social History Social History   Tobacco Use  . Smoking status: Current Every Day Smoker    Packs/day: 0.50    Types: Cigarettes  . Smokeless tobacco: Never Used  Substance Use Topics  . Alcohol use: Yes    Comment: occ  . Drug  use: No     Allergies   Patient has no known allergies.   Review of Systems Review of Systems  Constitutional: Negative for chills and fever.  Genitourinary: Positive for vaginal pain.     Physical Exam Updated Vital Signs BP 124/86 (BP Location: Right Arm)   Pulse 92   Temp 98.1 F (36.7 C) (Oral)   Resp 20   Ht 5\' 2"  (1.575 m)   Wt 60.7 kg (133 lb 13.1 oz)   SpO2 98%   BMI 24.48 kg/m   Physical Exam  Constitutional: She is oriented to person, place, and time. She appears well-developed and well-nourished. No distress.  HENT:  Head: Normocephalic and atraumatic.  Eyes: EOM are normal.  Neck: Normal range of motion.  Cardiovascular: Normal rate, regular rhythm and intact distal pulses.  Pulmonary/Chest: Effort normal and breath sounds normal. No respiratory distress. She has no wheezes. She has no rales.  Abdominal: She exhibits no distension.  Genitourinary:  Genitourinary Comments: Chaperone present.  Approximately 1.5 cm diameter fluctuant, well-circumscribed lesion of the left outer labia.  No extension towards the vagina or rectum.  Musculoskeletal: Normal range of motion.  Neurological: She  is alert and oriented to person, place, and time.  Skin: Skin is warm. No rash noted.  Psychiatric: She has a normal mood and affect.  Nursing note and vitals reviewed.    ED Treatments / Results  Labs (all labs ordered are listed, but only abnormal results are displayed) Labs Reviewed - No data to display  EKG  EKG Interpretation None       Radiology No results found.  Procedures .Marland Kitchen.Incision and Drainage Date/Time: 05/10/2017 7:15 PM Performed by: Alveria Apleyaccavale, Tyrique Sporn, PA-C Authorized by: Alveria Apleyaccavale, Nessie Nong, PA-C   Consent:    Consent obtained:  Verbal   Consent given by:  Patient   Risks discussed:  Bleeding, incomplete drainage, infection and pain Location:    Type:  Abscess   Location:  Anogenital   Anogenital location: L outer labia. Pre-procedure  details:    Skin preparation:  Betadine Anesthesia (see MAR for exact dosages):    Anesthesia method:  Local infiltration   Local anesthetic:  Lidocaine 2% w/o epi Procedure type:    Complexity:  Simple Procedure details:    Incision types:  Single straight   Incision depth:  Dermal   Scalpel blade:  11   Wound management:  Probed and deloculated and irrigated with saline   Drainage:  Bloody and purulent   Drainage amount:  Moderate   Wound treatment:  Wound left open   Packing materials:  None Post-procedure details:    Patient tolerance of procedure:  Tolerated well, no immediate complications   (including critical care time)  Medications Ordered in ED Medications  Tdap (BOOSTRIX) injection 0.5 mL (0.5 mLs Intramuscular Given 05/10/17 1829)  lidocaine (XYLOCAINE) 2 % (with pres) injection 200 mg (200 mg Infiltration Given by Other 05/10/17 1830)     Initial Impression / Assessment and Plan / ED Course  I have reviewed the triage vital signs and the nursing notes.  Pertinent labs & imaging results that were available during my care of the patient were reviewed by me and considered in my medical decision making (see chart for details).     Patient with abscess of left outer labia.  No systemic signs of infection including fever, chills, vomiting, or tachycardia.  Tetanus updated today.  I&D performed, purulent material expressed.  Discussed aftercare instructions.  As it is in a difficult area to drain, will place on doxycycline.  Patient to return if symptoms worsen.  At this time, patient present for discharge.  Return precautions given.  Patient states she understands and agrees to plan.   Final Clinical Impressions(s) / ED Diagnoses   Final diagnoses:  Abscess    ED Discharge Orders        Ordered    doxycycline (VIBRAMYCIN) 100 MG capsule  2 times daily     05/10/17 1916       Alveria ApleyCaccavale, Nolyn Eilert, PA-C 05/11/17 0116    Arby BarrettePfeiffer, Marcy, MD 05/12/17 [phone]

## 2017-05-10 NOTE — Discharge Instructions (Signed)
Take antibiotics as prescribed.  Take the entire course of antibiotics, even if your symptoms are improving. Use Tylenol or ibuprofen as needed for pain. Keep the area covered for the next 24 hours.  After this, you may remove dressing, wash gently soap and water, and reapply new dressing. Keep a dressing on until there is no more bleeding or draining. Wear cotton underwear that is breathable. Try to avoid tight fitting pants, staying with loose skirts, dresses, or loose pants. Return to the emergency room if you develop fevers, chills, vomiting, returning symptoms, or any new or worsening symptoms.

## 2017-05-10 NOTE — ED Notes (Signed)
Pt discharged to home with family. NAD.  

## 2017-05-10 NOTE — ED Triage Notes (Signed)
C/o vaginal abscess x4 days-NAD-steady gait

## 2017-05-14 MED FILL — DOXYCYCLINE HYCLATE 100 MG: 100 | 10 days supply | Qty: 20 | Fill #0

## 2017-07-19 ENCOUNTER — Emergency Department (HOSPITAL_BASED_OUTPATIENT_CLINIC_OR_DEPARTMENT_OTHER)
Admission: EM | Admit: 2017-07-19 | Discharge: 2017-07-19 | Disposition: A | Discharge status: Home / Self Care | Payer: Self-pay | Attending: Emergency Medicine | Admitting: Emergency Medicine

## 2017-07-19 ENCOUNTER — Other Ambulatory Visit: Payer: Self-pay

## 2017-07-19 ENCOUNTER — Encounter (HOSPITAL_BASED_OUTPATIENT_CLINIC_OR_DEPARTMENT_OTHER): Payer: Self-pay | Admitting: *Deleted

## 2017-07-19 DIAGNOSIS — R109 Unspecified abdominal pain: Secondary | ICD-10-CM | POA: Insufficient documentation

## 2017-07-19 DIAGNOSIS — Z5321 Procedure and treatment not carried out due to patient leaving prior to being seen by health care provider: Secondary | ICD-10-CM | POA: Insufficient documentation

## 2017-07-19 LAB — URINALYSIS, ROUTINE W REFLEX MICROSCOPIC
Glucose, UA: NEGATIVE mg/dL
Hgb urine dipstick: NEGATIVE
KETONES UR: 15 mg/dL — AB
LEUKOCYTES UA: NEGATIVE
NITRITE: NEGATIVE
PROTEIN: NEGATIVE mg/dL
Specific Gravity, Urine: >1.03 — ABNORMAL HIGH (ref 1.005–1.030)
pH: 6 (ref 5.0–8.0)

## 2017-07-19 LAB — PREGNANCY, URINE: PREG TEST UR: NEGATIVE

## 2017-07-19 NOTE — ED Notes (Signed)
Unable to locate  [employer]   Vending area    Bathrooms   And outside

## 2017-07-19 NOTE — ED Notes (Addendum)
Not in ED WR when called for tx area 

## 2017-07-19 NOTE — ED Triage Notes (Signed)
Pt c/o diffuse abd pain  And n/v/d

## 2017-09-08 ENCOUNTER — Other Ambulatory Visit: Payer: Self-pay

## 2017-09-08 ENCOUNTER — Encounter (HOSPITAL_BASED_OUTPATIENT_CLINIC_OR_DEPARTMENT_OTHER): Payer: Self-pay

## 2017-09-08 ENCOUNTER — Emergency Department (HOSPITAL_BASED_OUTPATIENT_CLINIC_OR_DEPARTMENT_OTHER)
Admission: EM | Admit: 2017-09-08 | Discharge: 2017-09-08 | Disposition: A | Discharge status: Home / Self Care | Payer: Medicaid Other | Attending: Emergency Medicine | Admitting: Emergency Medicine

## 2017-09-08 DIAGNOSIS — K0889 Other specified disorders of teeth and supporting structures: Secondary | ICD-10-CM

## 2017-09-08 DIAGNOSIS — F17210 Nicotine dependence, cigarettes, uncomplicated: Secondary | ICD-10-CM | POA: Insufficient documentation

## 2017-09-08 MED ORDER — PENICILLIN V POTASSIUM 500 MG PO TABS: 500.0000 mg | ORAL_TABLET | Freq: Four times a day (QID) | ORAL | 0 refills | Status: AC

## 2017-09-08 MED ORDER — NAPROXEN 500 MG PO TABS: 500.0000 mg | ORAL_TABLET | Freq: Two times a day (BID) | ORAL | 0 refills | Status: DC

## 2017-09-08 MED FILL — PENICILLIN VK 500 MG TABLET: 500 | 7 days supply | Qty: 28 | Fill #0

## 2017-09-08 MED FILL — NAPROXEN 500 MG TABLET: 500 | 15 days supply | Qty: 30 | Fill #0

## 2017-09-08 NOTE — ED Provider Notes (Signed)
[facility] EMERGENCY DEPARTMENT Provider Note   CSN: [id number] Arrival date & time: 09/08/17  1208     History   Chief Complaint Chief Complaint  Patient presents with  . Dental Pain    HPI Alicia Rodriguez is a 27 y.o. female.  HPI  Alicia Rodriguez is a 27 year old female with a history of eczema who presents to the emergency department for evaluation of left lower tooth pain.  Patient reports that pain began yesterday morning.  She states that she had a molar removed last year, and now has pain over the gumline in that area.  She reports pain only occurs with chewing or drinking cold liquids.  She states that at its worst pain is about a 6/10 in severity and feels "throbbing."  She has tried taking Tylenol without significant improvement of her symptoms.  She also noted some mild swelling over the left lower jaw which concerned her.  She denies fevers, chills, throat closing, trouble breathing, trismus, neck pain/swelling, abdominal pain, nausea/vomiting.  She does not have a Education officer, communitydentist.  Past Medical History:  Diagnosis Date  . No pertinent past medical history     There are no active problems to display for this patient.   Past Surgical History:  Procedure Laterality Date  . TONSILLECTOMY       OB History    Gravida  0   Para      Term      Preterm      AB      Living        SAB      TAB      Ectopic      Multiple      Live Births               Home Medications    Prior to Admission medications   Not on File    Family History Family History  Problem Relation Age of Onset  . Other Neg Hx     Social History Social History   Tobacco Use  . Smoking status: Current Every Day Smoker    Packs/day: 0.50    Types: Cigarettes  . Smokeless tobacco: Never Used  Substance Use Topics  . Alcohol use: Yes    Comment: occ  . Drug use: No     Allergies   Patient has no known allergies.   Review of Systems Review of Systems    Constitutional: Negative for chills and fever.  HENT: Positive for dental problem and facial swelling.   Gastrointestinal: Negative for abdominal pain, nausea and vomiting.  Musculoskeletal: Negative for neck pain.  Skin: Negative for color change.     Physical Exam Updated Vital Signs BP 116/79 (BP Location: Left Arm)   Pulse 84   Temp 98.3 F (36.8 C) (Oral)   Resp 16   Ht 5\' 2"  (1.575 m)   Wt 61.2 kg (135 lb)   LMP 08/12/2017   SpO2 100%   BMI 24.69 kg/m   Physical Exam  Constitutional: She appears well-developed and well-nourished. No distress.  HENT:  Head: Normocephalic and atraumatic.  Mouth/Throat:    Dental cavities and poor oral dentition noted. Pain along gumline where there is a missing molar as depicted in image. No abscess noted. Midline uvula. No trismus. OP moist and clear. No oropharyngeal erythema or edema. Neck supple with no tenderness. Mild left lower facial swelling, non-tender to palpation. No erythema, warmth over the face.    Eyes:  Right eye exhibits no discharge. Left eye exhibits no discharge.  Pulmonary/Chest: Effort normal. No respiratory distress.  Neurological: She is alert. Coordination normal.  Skin: She is not diaphoretic.  Psychiatric: She has a normal mood and affect. Her behavior is normal.  Nursing note and vitals reviewed.    ED Treatments / Results  Labs (all labs ordered are listed, but only abnormal results are displayed) Labs Reviewed - No data to display  EKG None  Radiology No results found.  Procedures Procedures (including critical care time)  Medications Ordered in ED Medications - No data to display   Initial Impression / Assessment and Plan / ED Course  I have reviewed the triage vital signs and the nursing notes.  Pertinent labs & imaging results that were available during my care of the patient were reviewed by me and considered in my medical decision making (see chart for details).    Patient with  toothache and mild left lower facial swelling.  No gross abscess.  Exam unconcerning for Ludwig's angina or spread of infection.  Will treat with penicillin and anti-inflammatories medicine.  Urged patient to follow-up with dentist and have provided her with dental resource guide in the emergency department.  Counseled her on return precautions and patient agrees and voiced understanding to the above plan.  She appears reliable to follow-up.  Final Clinical Impressions(s) / ED Diagnoses   Final diagnoses:  Pain, dental    ED Discharge Orders        Ordered    penicillin v potassium (VEETID) 500 MG tablet  4 times daily     09/08/17 1622    naproxen (NAPROSYN) 500 MG tablet  2 times daily     09/08/17 1622       Kellie Shropshire, PA-C 09/08/17 1624    Alvira Monday, MD 09/10/17 1348

## 2017-09-08 NOTE — ED Triage Notes (Signed)
C/o left lower toothache x 2 days-states she had tooth pulled from area last year-NAD-steady gait

## 2017-09-08 NOTE — Discharge Instructions (Signed)
I read your prescription for antibiotic.  Please take this as prescribed.  Please take all of your antibiotics until finished!   You may develop abdominal discomfort or diarrhea from the antibiotic.  You may help offset this with probiotics which you can buy or get in yogurt. Do not eat  or take the probiotics until 2 hours after your antibiotic.   Schedule an appointment with dentist, using the dental resource guide provided.  As we discussed, return to the emergency department if you develop a fever or swelling spreads to your neck.  Please also return if you have trouble breathing or have any new or concerning symptoms.

## 2017-12-20 ENCOUNTER — Encounter (HOSPITAL_BASED_OUTPATIENT_CLINIC_OR_DEPARTMENT_OTHER): Payer: Self-pay | Admitting: *Deleted

## 2017-12-20 ENCOUNTER — Emergency Department (HOSPITAL_BASED_OUTPATIENT_CLINIC_OR_DEPARTMENT_OTHER)
Admission: EM | Admit: 2017-12-20 | Discharge: 2017-12-20 | Disposition: A | Discharge status: Home / Self Care | Payer: Medicaid Other | Attending: Emergency Medicine | Admitting: Emergency Medicine

## 2017-12-20 ENCOUNTER — Other Ambulatory Visit: Payer: Self-pay

## 2017-12-20 DIAGNOSIS — R12 Heartburn: Secondary | ICD-10-CM | POA: Insufficient documentation

## 2017-12-20 DIAGNOSIS — R1013 Epigastric pain: Secondary | ICD-10-CM

## 2017-12-20 DIAGNOSIS — R112 Nausea with vomiting, unspecified: Secondary | ICD-10-CM | POA: Insufficient documentation

## 2017-12-20 DIAGNOSIS — F17210 Nicotine dependence, cigarettes, uncomplicated: Secondary | ICD-10-CM | POA: Insufficient documentation

## 2017-12-20 DIAGNOSIS — R197 Diarrhea, unspecified: Secondary | ICD-10-CM | POA: Insufficient documentation

## 2017-12-20 LAB — COMPREHENSIVE METABOLIC PANEL
ALBUMIN: 4.2 g/dL (ref 3.5–5.0)
ALT: 14 U/L (ref 0–44)
AST: 20 U/L (ref 15–41)
Alkaline Phosphatase: 54 U/L (ref 38–126)
Anion gap: 9 (ref 5–15)
BUN: 15 mg/dL (ref 6–20)
CHLORIDE: 107 mmol/L (ref 98–111)
CO2: 23 mmol/L (ref 22–32)
CREATININE: 0.86 mg/dL (ref 0.44–1.00)
Calcium: 9.5 mg/dL (ref 8.9–10.3)
GFR calc non Af Amer: >60 mL/min (ref >60)
Glucose, Bld: 101 mg/dL — ABNORMAL HIGH (ref 70–99)
Potassium: 3.5 mmol/L (ref 3.5–5.1)
SODIUM: 139 mmol/L (ref 135–145)
Total Bilirubin: 0.5 mg/dL (ref 0.3–1.2)
Total Protein: 7.8 g/dL (ref 6.5–8.1)

## 2017-12-20 LAB — CBC WITH DIFFERENTIAL/PLATELET
Basophils Absolute: 0 10*3/uL (ref 0.0–0.1)
Basophils Relative: 0 %
EOS PCT: 2 %
Eosinophils Absolute: 0.3 10*3/uL (ref 0.0–0.7)
HCT: 40.2 % (ref 36.0–46.0)
HEMOGLOBIN: 15.2 g/dL — AB (ref 12.0–15.0)
LYMPHS ABS: 5.1 10*3/uL — AB (ref 0.7–4.0)
LYMPHS PCT: 41 %
MCH: 31.2 pg (ref 26.0–34.0)
MCHC: 37.8 g/dL — ABNORMAL HIGH (ref 30.0–36.0)
MCV: 82.5 fL (ref 78.0–100.0)
Monocytes Absolute: 0.9 10*3/uL (ref 0.1–1.0)
Monocytes Relative: 7 %
NEUTROS PCT: 50 %
Neutro Abs: 6.1 10*3/uL (ref 1.7–7.7)
Platelets: 334 10*3/uL (ref 150–400)
RBC: 4.87 MIL/uL (ref 3.87–5.11)
RDW: 13.2 % (ref 11.5–15.5)
WBC: 12.3 10*3/uL — ABNORMAL HIGH (ref 4.0–10.5)

## 2017-12-20 LAB — PREGNANCY, URINE: PREG TEST UR: NEGATIVE

## 2017-12-20 LAB — URINALYSIS, ROUTINE W REFLEX MICROSCOPIC
Bilirubin Urine: NEGATIVE
GLUCOSE, UA: NEGATIVE mg/dL
HGB URINE DIPSTICK: NEGATIVE
Ketones, ur: NEGATIVE mg/dL
LEUKOCYTES UA: NEGATIVE
Nitrite: NEGATIVE
PROTEIN: NEGATIVE mg/dL
pH: 5.5 (ref 5.0–8.0)

## 2017-12-20 LAB — LIPASE, BLOOD: Lipase: 32 U/L (ref 11–51)

## 2017-12-20 MED ORDER — OMEPRAZOLE 20 MG PO CPDR: 20.0000 mg | DELAYED_RELEASE_CAPSULE | Freq: Every day | ORAL | 0 refills | Status: DC

## 2017-12-20 NOTE — ED Provider Notes (Signed)
[facility] EMERGENCY DEPARTMENT Provider Note   CSN: [id number] Arrival date & time: 12/20/17  1519     History   Chief Complaint Chief Complaint  Patient presents with  . Abdominal Pain    HPI Alicia Rodriguez is a 27 y.o. female.  27 year old female who presents with abdominal pain.  Over the past few months, the patient has had intermittent episodes of upper abdominal pain that often occur in the morning after she wakes up.  The pain is in the midepigastrium and feels crampy in nature.  She will sometimes have associated nausea and vomiting of "foamy" emesis.  She occasionally has episodes of diarrhea as well.  She denies any blood in the stool.  She denies any postprandial pain but does endorse heartburn.  She has not tried any medications for her symptoms.  She presents today because the intermittent crampy pain has been worse over the past 2 days.  She denies any alcohol use or heavy NSAID use.  No vaginal bleeding, vaginal discharge, urinary symptoms, fevers, or cough/cold symptoms.  The history is provided by the patient.  Abdominal Pain   Associated symptoms include diarrhea, nausea and vomiting. Pertinent negatives include fever and dysuria.    Past Medical History:  Diagnosis Date  . No pertinent past medical history     There are no active problems to display for this patient.   Past Surgical History:  Procedure Laterality Date  . TONSILLECTOMY       OB History    Gravida  0   Para      Term      Preterm      AB      Living        SAB      TAB      Ectopic      Multiple      Live Births               Home Medications    Prior to Admission medications   Medication Sig Start Date End Date Taking? Authorizing Provider  naproxen (NAPROSYN) 500 MG tablet Take 1 tablet (500 mg total) by mouth 2 (two) times daily. 09/08/17   Kellie ShropshireShrosbree, Emily J, PA-C  omeprazole (PRILOSEC) 20 MG capsule Take 1 capsule (20 mg total) by mouth daily.  12/20/17   Garlen Reinig, Ambrose Finlandachel Morgan, MD    Family History Family History  Problem Relation Age of Onset  . Other Neg Hx     Social History Social History   Tobacco Use  . Smoking status: Current Every Day Smoker    Packs/day: 0.50    Types: Cigarettes  . Smokeless tobacco: Never Used  Substance Use Topics  . Alcohol use: Yes    Comment: occ  . Drug use: No     Allergies   Patient has no known allergies.   Review of Systems Review of Systems  Constitutional: Negative for fever.  Respiratory: Negative for cough.   Gastrointestinal: Positive for abdominal pain, diarrhea, nausea and vomiting. Negative for blood in stool.  Genitourinary: Negative for dysuria, vaginal bleeding and vaginal discharge.   All other systems reviewed and are negative except that which was mentioned in HPI   Physical Exam Updated Vital Signs BP (!) 130/97 (BP Location: Left Arm)   Pulse 90   Temp 98.4 F (36.9 C) (Oral)   Resp 18   Ht 5\' 2"  (1.575 m)   Wt 63.5 kg (140 lb)   LMP 11/22/2017  SpO2 100%   BMI 25.61 kg/m   Physical Exam  Constitutional: She is oriented to person, place, and time. She appears well-developed and well-nourished. No distress.  HENT:  Head: Normocephalic and atraumatic.  Mouth/Throat: Oropharynx is clear and moist.  Moist mucous membranes  Eyes: Conjunctivae are normal.  Neck: Neck supple.  Cardiovascular: Normal rate, regular rhythm and normal heart sounds.  No murmur heard. Pulmonary/Chest: Effort normal and breath sounds normal.  Abdominal: Soft. Bowel sounds are normal. She exhibits no distension. There is tenderness in the epigastric area. There is no rebound and no guarding.  Musculoskeletal: She exhibits no edema.  Neurological: She is alert and oriented to person, place, and time.  Fluent speech  Skin: Skin is warm and dry.  Psychiatric: She has a normal mood and affect. Judgment normal.  Nursing note and vitals reviewed.    ED Treatments /  Results  Labs (all labs ordered are listed, but only abnormal results are displayed) Labs Reviewed  URINALYSIS, ROUTINE W REFLEX MICROSCOPIC - Abnormal; Notable for the following components:      Result Value   Specific Gravity, Urine >1.030 (*)    All other components within normal limits  COMPREHENSIVE METABOLIC PANEL - Abnormal; Notable for the following components:   Glucose, Bld 101 (*)    All other components within normal limits  CBC WITH DIFFERENTIAL/PLATELET - Abnormal; Notable for the following components:   WBC 12.3 (*)    Hemoglobin 15.2 (*)    MCHC 37.8 (*)    Lymphs Abs 5.1 (*)    All other components within normal limits  PREGNANCY, URINE  LIPASE, BLOOD    EKG None  Radiology No results found.  Procedures Procedures (including critical care time)  Medications Ordered in ED Medications - No data to display   Initial Impression / Assessment and Plan / ED Course  I have reviewed the triage vital signs and the nursing notes.  Pertinent labs & imaging results that were available during my care of the patient were reviewed by me and considered in my medical decision making (see chart for details).     Mild epigastric tenderness on exam.  Labs unremarkable including normal LFTs and lipase. Her pattern of symptoms in the morning associated with epigastric pain suggests possible GERD or PUD.   Will treat with PPI course and counseled on GERD diet. Extensively reviewed return precautions. Instructed to f/u with PCP to assess for improvement. She voiced understanding. Final Clinical Impressions(s) / ED Diagnoses   Final diagnoses:  Epigastric pain    ED Discharge Orders        Ordered    omeprazole (PRILOSEC) 20 MG capsule  Daily     12/20/17 1728       Yash Cacciola, Ambrose Finland, MD 12/20/17 1729

## 2017-12-20 NOTE — ED Triage Notes (Signed)
Pt c/o lower abd pain and nausea with dizziness x 3 days

## 2018-02-21 ENCOUNTER — Other Ambulatory Visit: Payer: Self-pay

## 2018-02-21 ENCOUNTER — Encounter (HOSPITAL_BASED_OUTPATIENT_CLINIC_OR_DEPARTMENT_OTHER): Payer: Self-pay | Admitting: *Deleted

## 2018-02-21 ENCOUNTER — Emergency Department (HOSPITAL_BASED_OUTPATIENT_CLINIC_OR_DEPARTMENT_OTHER)
Admission: EM | Admit: 2018-02-21 | Discharge: 2018-02-21 | Disposition: A | Discharge status: Home / Self Care | Payer: Self-pay | Attending: Emergency Medicine | Admitting: Emergency Medicine

## 2018-02-21 ENCOUNTER — Emergency Department (HOSPITAL_BASED_OUTPATIENT_CLINIC_OR_DEPARTMENT_OTHER): Payer: Self-pay

## 2018-02-21 DIAGNOSIS — J069 Acute upper respiratory infection, unspecified: Secondary | ICD-10-CM | POA: Insufficient documentation

## 2018-02-21 DIAGNOSIS — R05 Cough: Secondary | ICD-10-CM | POA: Insufficient documentation

## 2018-02-21 DIAGNOSIS — Z79899 Other long term (current) drug therapy: Secondary | ICD-10-CM | POA: Insufficient documentation

## 2018-02-21 DIAGNOSIS — J3489 Other specified disorders of nose and nasal sinuses: Secondary | ICD-10-CM | POA: Insufficient documentation

## 2018-02-21 DIAGNOSIS — F17210 Nicotine dependence, cigarettes, uncomplicated: Secondary | ICD-10-CM | POA: Insufficient documentation

## 2018-02-21 DIAGNOSIS — R070 Pain in throat: Secondary | ICD-10-CM | POA: Insufficient documentation

## 2018-02-21 MED ORDER — IBUPROFEN 800 MG PO TABS: 800.0000 mg | ORAL_TABLET | Freq: Three times a day (TID) | ORAL | 0 refills | Status: DC | PRN

## 2018-02-21 MED ORDER — BENZONATATE 100 MG PO CAPS: 100.0000 mg | ORAL_CAPSULE | Freq: Three times a day (TID) | ORAL | 0 refills | Status: DC

## 2018-02-21 MED ORDER — FLUTICASONE PROPIONATE 50 MCG/ACT NA SUSP: 1.0000 | Freq: Every day | NASAL | 0 refills | Status: DC

## 2018-02-21 NOTE — Discharge Instructions (Addendum)
You were seen in the emergency today for upper respiratory symptoms, your chest x-ray was normal, we suspect your symptoms are related to allergies or a virus at this time.  I have prescribed you multiple medications to treat your symptoms.   -Flonase to be used 1 spray in each nostril daily.  This medication is used to treat your congestion.  -Tessalon can be taken once every 8 hours as needed.  This medication is used to treat your cough.  -Ibuprofen to be taken once every 8 hours as needed for pain. Please take this medicine with food as it can cause stomach upset and at worst stomach bleeding. Do not take other NSAIDs such as motrin, aleve, advil, naproxen, mobic, etc as they are similar. You make take tylenol per over the counter dosing with this medicine safely.  We have prescribed you new medication(s) today. Discuss the medications prescribed today with your pharmacist as they can have adverse effects and interactions with your other medicines including over the counter and prescribed medications. Seek medical evaluation if you start to experience new or abnormal symptoms after taking one of these medicines, seek care immediately if you start to experience difficulty breathing, feeling of your throat closing, facial swelling, or rash as these could be indications of a more serious allergic reaction  You will need to follow-up with your primary care provider in 1 week if your symptoms have not improved.  If you do not have a primary care provider one is provided in your discharge instructions.  Return to the emergency department for any new or worsening symptoms including but not limited to persistent fever for 5 days, difficulty breathing, chest pain, rashes, passing out, or any other concerns.    Your blood pressure was elevated in the ER today, please have this rechecked by a primary care provider within 1-2 weeks.  Vitals:   02/21/18 1207  BP: (!) 130/94  Pulse: 93  Resp: 18  Temp: 98.3  F (36.8 C)  SpO2: 100%

## 2018-02-21 NOTE — ED Provider Notes (Signed)
[facility] EMERGENCY DEPARTMENT Provider Note   CSN: [id number] Arrival date & time: 02/21/18  1148  History   Chief Complaint Chief Complaint  Patient presents with  . URI    HPI Alicia Rodriguez is a 27 y.o. female with a hx of tobacco abuse who is s/p tonsillectomy presents to the ED with complaints of URI sxs x 4 days. Patient reports congestion, rhinorrhea, sinus pain/pressure, mild sore throat, and productive cough with green mucous sputum. She has had some chills and subjective fever. Has tried nyquil without significant relief, no other specific alleviating/aggravating factors. Denies dyspnea, chest pain, ear pain, wheezing, trouble swallowing, or vomiting. Denies tic exposures or rashes.   HPI  Past Medical History:  Diagnosis Date  . No pertinent past medical history     There are no active problems to display for this patient.   Past Surgical History:  Procedure Laterality Date  . TONSILLECTOMY       OB History    Gravida  0   Para      Term      Preterm      AB      Living        SAB      TAB      Ectopic      Multiple      Live Births               Home Medications    Prior to Admission medications   Medication Sig Start Date End Date Taking? Authorizing Provider  naproxen (NAPROSYN) 500 MG tablet Take 1 tablet (500 mg total) by mouth 2 (two) times daily. 09/08/17   Kellie Shropshire, PA-C  omeprazole (PRILOSEC) 20 MG capsule Take 1 capsule (20 mg total) by mouth daily. 12/20/17   Little, Ambrose Finland, MD    Family History Family History  Problem Relation Age of Onset  . Other Neg Hx     Social History Social History   Tobacco Use  . Smoking status: Current Every Day Smoker    Packs/day: 0.50    Types: Cigarettes  . Smokeless tobacco: Never Used  Substance Use Topics  . Alcohol use: Yes    Comment: occ  . Drug use: No     Allergies   Patient has no known allergies.   Review of Systems Review of  Systems  Constitutional: Positive for chills and fever (subjective).  HENT: Positive for congestion, rhinorrhea, sinus pressure, sinus pain and sore throat. Negative for ear pain, trouble swallowing and voice change.   Respiratory: Positive for cough. Negative for shortness of breath, wheezing and stridor.   Cardiovascular: Negative for chest pain.  Gastrointestinal: Negative for abdominal distention, abdominal pain, nausea and vomiting.  Skin: Negative for rash.     Physical Exam Updated Vital Signs BP (!) 130/94 (BP Location: Left Arm)   Pulse 93   Temp 98.3 F (36.8 C) (Oral)   Resp 18   Ht 5\' 2"  (1.575 m)   Wt 68 kg   LMP 02/09/2018   SpO2 100%   BMI 27.44 kg/m   Physical Exam  Constitutional: She appears well-developed and well-nourished. No distress.  HENT:  Head: Normocephalic and atraumatic.  Right Ear: Tympanic membrane is not perforated, not erythematous, not retracted and not bulging.  Left Ear: Tympanic membrane is not perforated, not erythematous, not retracted and not bulging.  Nose: Mucosal edema present. Right sinus exhibits frontal sinus tenderness. Right sinus exhibits no maxillary  sinus tenderness. Left sinus exhibits frontal sinus tenderness. Left sinus exhibits no maxillary sinus tenderness.  Mouth/Throat: Uvula is midline. Posterior oropharyngeal erythema (mild) present. No oropharyngeal exudate.  Tonsils are surgically absent. No trismus. No drooling. No hot potato voice. Submandibular compartment is soft.   Eyes: Pupils are equal, round, and reactive to light. Conjunctivae are normal. Right eye exhibits no discharge. Left eye exhibits no discharge.  Neck: Normal range of motion. Neck supple. No neck rigidity. No edema and no erythema present.  Cardiovascular: Normal rate and regular rhythm.  No murmur heard. Pulmonary/Chest: Breath sounds normal. No respiratory distress. She has no wheezes. She has no rales.  Abdominal: Soft. She exhibits no distension.  There is no tenderness.  Lymphadenopathy:    She has no cervical adenopathy.  Neurological: She is alert.  Skin: Skin is warm and dry. No rash noted.  Psychiatric: She has a normal mood and affect. Her behavior is normal.  Nursing note and vitals reviewed.    ED Treatments / Results  Labs (all labs ordered are listed, but only abnormal results are displayed) Labs Reviewed - No data to display  EKG None  Radiology Dg Chest 2 View  Result Date: 02/21/2018 CLINICAL DATA:  Cough and congestion EXAM: CHEST - 2 VIEW COMPARISON:  None. FINDINGS: Lungs are clear. Heart size and pulmonary vascularity are normal. No adenopathy. No pneumothorax. No bone lesions. IMPRESSION: No edema or consolidation. Electronically Signed   By: Bretta Bang III M.D.   On: 02/21/2018 13:18    Procedures Procedures (including critical care time)  Medications Ordered in ED Medications - No data to display   Initial Impression / Assessment and Plan / ED Course  I have reviewed the triage vital signs and the nursing notes.  Pertinent labs & imaging results that were available during my care of the patient were reviewed by me and considered in my medical decision making (see chart for details).    Patient presents with URI type symptoms.  Patient is nontoxic appearing, in no apparent distress, vitals are WNL with the exception of elevated BP, doubt HTN emergency, patient aware of need for recheck. Patient is afebrile in the ED, lungs are CTA, CXR negative for infiltrate, doubt pneumonia. There is no wheezing or signs of respiratory distress. Sxs onset < 7 days, afebrile,  doubt acute bacterial sinusitis. Centor score 0, doubt strep pharyngitis. No evidence of AOM on exam. No meningeal signs. No history components or rashes to raise concern for tic borne illness. Suspect viral vs allergic etiology at this time and will treat supportively with Ibuprofen, Flonase, and Tessalon. I discussed results, treatment  plan, need for PCP follow-up, and return precautions with the patient. Provided opportunity for questions, patient confirmed understanding and is in agreement with plan.    Final Clinical Impressions(s) / ED Diagnoses   Final diagnoses:  URI with cough and congestion    ED Discharge Orders         Ordered    fluticasone (FLONASE) 50 MCG/ACT nasal spray  Daily     02/21/18 1357    benzonatate (TESSALON) 100 MG capsule  Every 8 hours     02/21/18 1357    ibuprofen (ADVIL,MOTRIN) 800 MG tablet  Every 8 hours PRN     02/21/18 1357           Erven Ramson, Rogers R, PA-C 02/21/18 1359    Jacalyn Lefevre, MD 02/21/18 1412

## 2018-02-21 NOTE — ED Triage Notes (Signed)
Cough, congestion, head pressure and sore throat x 3 days.

## 2018-04-11 ENCOUNTER — Encounter (HOSPITAL_COMMUNITY): Payer: Self-pay | Admitting: *Deleted

## 2018-04-11 ENCOUNTER — Emergency Department (HOSPITAL_COMMUNITY)
Admission: EM | Admit: 2018-04-11 | Discharge: 2018-04-11 | Disposition: A | Discharge status: Home / Self Care | Payer: Medicaid Other | Attending: Emergency Medicine | Admitting: Emergency Medicine

## 2018-04-11 DIAGNOSIS — F17210 Nicotine dependence, cigarettes, uncomplicated: Secondary | ICD-10-CM | POA: Insufficient documentation

## 2018-04-11 DIAGNOSIS — K029 Dental caries, unspecified: Secondary | ICD-10-CM | POA: Insufficient documentation

## 2018-04-11 DIAGNOSIS — Z79899 Other long term (current) drug therapy: Secondary | ICD-10-CM | POA: Insufficient documentation

## 2018-04-11 MED ORDER — NAPROXEN 375 MG PO TABS: 375.0000 mg | ORAL_TABLET | Freq: Two times a day (BID) | ORAL | 0 refills | Status: DC

## 2018-04-11 MED ORDER — AMOXICILLIN 500 MG PO CAPS: 500.0000 mg | ORAL_CAPSULE | Freq: Three times a day (TID) | ORAL | 0 refills | Status: DC

## 2018-04-11 NOTE — ED Provider Notes (Signed)
[facility] EMERGENCY DEPARTMENT Provider Note   CSN: [id number] Arrival date & time: 04/11/18  1555     History   Chief Complaint Chief Complaint  Patient presents with  . Dental Pain    HPI Alicia Rodriguez is a 27 y.o. female who presents to the ED with c/o dental pain. The pain is located on the left side and started yesterday. Patient denies fever or other problems.   HPI  Past Medical History:  Diagnosis Date  . No pertinent past medical history     There are no active problems to display for this patient.   Past Surgical History:  Procedure Laterality Date  . TONSILLECTOMY       OB History    Gravida  0   Para      Term      Preterm      AB      Living        SAB      TAB      Ectopic      Multiple      Live Births               Home Medications    Prior to Admission medications   Medication Sig Start Date End Date Taking? Authorizing Provider  amoxicillin (AMOXIL) 500 MG capsule Take 1 capsule (500 mg total) by mouth 3 (three) times daily. 04/11/18   Janne Napoleon, NP  benzonatate (TESSALON) 100 MG capsule Take 1 capsule (100 mg total) by mouth every 8 (eight) hours. 02/21/18   Petrucelli, Samantha R, PA-C  fluticasone (FLONASE) 50 MCG/ACT nasal spray Place 1 spray into both nostrils daily. 02/21/18   Petrucelli, Samantha R, PA-C  ibuprofen (ADVIL,MOTRIN) 800 MG tablet Take 1 tablet (800 mg total) by mouth every 8 (eight) hours as needed. 02/21/18   Petrucelli, Samantha R, PA-C  naproxen (NAPROSYN) 375 MG tablet Take 1 tablet (375 mg total) by mouth 2 (two) times daily. 04/11/18   Janne Napoleon, NP  omeprazole (PRILOSEC) 20 MG capsule Take 1 capsule (20 mg total) by mouth daily. 12/20/17   Little, Ambrose Finland, MD    Family History Family History  Problem Relation Age of Onset  . Other Neg Hx     Social History Social History   Tobacco Use  . Smoking status: Current Every Day Smoker    Packs/day: 0.50   Types: Cigarettes  . Smokeless tobacco: Never Used  Substance Use Topics  . Alcohol use: Yes    Comment: occ  . Drug use: No     Allergies   Patient has no known allergies.   Review of Systems Review of Systems  HENT: Positive for dental problem.   All other systems reviewed and are negative.    Physical Exam Updated Vital Signs BP (!) 133/93 (BP Location: Right Arm)   Pulse 85   Temp 98 F (36.7 C) (Oral)   Resp 20   SpO2 100%   Physical Exam  Constitutional: She appears well-developed and well-nourished. No distress.  HENT:  Head: Normocephalic.  Right Ear: Tympanic membrane normal.  Left Ear: Tympanic membrane normal.  Nose: Nose normal.  Mouth/Throat: Oropharynx is clear and moist. Dental caries present.    Dental pain left lower with swelling of the gum surrounding the tooth.   Eyes: EOM are normal.  Neck: Normal range of motion. Neck supple.  Cardiovascular: Normal rate.  Pulmonary/Chest: Effort normal.  Musculoskeletal: Normal range of  motion.  Lymphadenopathy:    She has no cervical adenopathy.  Neurological: She is alert.  Skin: Skin is warm and dry.  Psychiatric: She has a normal mood and affect. Her behavior is normal.  Nursing note and vitals reviewed.    ED Treatments / Results  Labs (all labs ordered are listed, but only abnormal results are displayed) Labs Reviewed - No data to display  EKG None  Radiology No results found.  Procedures Procedures (including critical care time)  Medications Ordered in ED Medications - No data to display   Initial Impression / Assessment and Plan / ED Course  I have reviewed the triage vital signs and the nursing notes. Patient with toothache.  No gross abscess.  Exam unconcerning for Ludwig's angina or spread of infection.  Will treat with penicillin and anti-inflammatories medicine.  Urged patient to follow-up with dentist.   Final Clinical Impressions(s) / ED Diagnoses   Final diagnoses:    Dental caries    ED Discharge Orders         Ordered    amoxicillin (AMOXIL) 500 MG capsule  3 times daily     04/11/18 1752    naproxen (NAPROSYN) 375 MG tablet  2 times daily     04/11/18 1752           Damian Leavell [place], NP 04/11/18 1755    Mancel Bale, MD 04/12/18 1553

## 2018-04-11 NOTE — Discharge Instructions (Addendum)
Call Dr. Hinda Glatter' office int the morning to schedule an appointment. Tell them you were seen in the ED and referred. You may take tylenol in addition to the other medications if needed.

## 2018-04-11 NOTE — ED Triage Notes (Signed)
Pt in c/o left sided dental pain that started yesterday, pain worse with eating, no distress noted

## 2018-12-07 ENCOUNTER — Encounter (HOSPITAL_BASED_OUTPATIENT_CLINIC_OR_DEPARTMENT_OTHER): Payer: Self-pay | Admitting: Emergency Medicine

## 2018-12-07 ENCOUNTER — Other Ambulatory Visit: Payer: Self-pay

## 2018-12-07 ENCOUNTER — Emergency Department (HOSPITAL_BASED_OUTPATIENT_CLINIC_OR_DEPARTMENT_OTHER)
Admission: EM | Admit: 2018-12-07 | Discharge: 2018-12-07 | Disposition: A | Discharge status: Home / Self Care | Payer: HRSA Program | Attending: Emergency Medicine | Admitting: Emergency Medicine

## 2018-12-07 DIAGNOSIS — Z20828 Contact with and (suspected) exposure to other viral communicable diseases: Secondary | ICD-10-CM | POA: Insufficient documentation

## 2018-12-07 DIAGNOSIS — R070 Pain in throat: Secondary | ICD-10-CM | POA: Insufficient documentation

## 2018-12-07 DIAGNOSIS — R05 Cough: Secondary | ICD-10-CM | POA: Insufficient documentation

## 2018-12-07 DIAGNOSIS — Z79899 Other long term (current) drug therapy: Secondary | ICD-10-CM | POA: Insufficient documentation

## 2018-12-07 DIAGNOSIS — F17210 Nicotine dependence, cigarettes, uncomplicated: Secondary | ICD-10-CM | POA: Insufficient documentation

## 2018-12-07 DIAGNOSIS — R059 Cough, unspecified: Secondary | ICD-10-CM

## 2018-12-07 NOTE — Discharge Instructions (Signed)
°If you live with, or provide care at home for, a person confirmed to have, or being evaluated for, COVID-19 infection °please follow these guidelines to prevent infection: ° °Follow healthcare provider’s instructions °Make sure that you understand and can help the patient follow any healthcare provider instructions for all care. ° °Provide for the patient’s basic needs °You should help the patient with basic needs in the home and provide support for getting groceries, prescriptions, and °other personal needs. ° °Monitor the patient’s symptoms °If they are getting sicker, call his or her medical provider a ° This will help the healthcare provider’s office take steps to keep other people from getting infected. °Ask the healthcare provider to call the local or state health department. ° °Limit the number of people who have contact with the patient °If possible, have only one caregiver for the patient. °Other household members should stay in another home or place of residence. If this is not possible, they should stay °in another room, or be separated from the patient as much as possible. Use a separate bathroom, if available. °Restrict visitors who do not have an essential need to be in the home. ° °Keep older adults, very young children, and other sick people away from the patient °Keep older adults, very young children, and those who have compromised immune systems or chronic health conditions away from the patient. This includes people with chronic heart, lung, or kidney conditions, diabetes, and cancer. ° °Ensure good ventilation °Make sure that shared spaces in the home have good air flow, such as from an air conditioner or an opened window, °weather permitting. ° °Wash your hands often °Wash your hands often and thoroughly with soap and water for at least 20 seconds. You can use an alcohol based hand sanitizer if soap and water are not available and if your hands are not visibly dirty. °Avoid touching your  eyes, nose, and mouth with unwashed hands. °Use disposable paper towels to dry your hands. If not available, use dedicated cloth towels and replace them when they become wet. ° °Wear a facemask and gloves °Wear a disposable facemask at all times in the room and gloves when you touch or have contact with the patient’s blood, body fluids, and/or secretions or excretions, such as sweat, saliva, sputum, nasal mucus, vomit, urine, or feces.  Ensure the mask fits over your nose and mouth tightly, and do not touch it during use. °Throw out disposable facemasks and gloves after using them. Do not reuse. °Wash your hands immediately after removing your facemask and gloves. °If your personal clothing becomes contaminated, carefully remove clothing and launder. Wash your hands after handling contaminated clothing. °Place all used disposable facemasks, gloves, and other waste in a lined container before disposing them with other household waste. °Remove gloves and wash your hands immediately after handling these items. ° °Do not share dishes, glasses, or other household items with the patient °Avoid sharing household items. You should not share dishes, drinking glasses, cups, eating utensils, towels, bedding, or other items °After the person uses these items, you should wash them thoroughly with soap and water. ° °Wash laundry thoroughly °Immediately remove and wash clothes or bedding that have blood, body fluids, and/or secretions or excretions, such as sweat, saliva, sputum, nasal mucus, vomit, urine, or feces, on them. °Wear gloves when handling laundry from the patient. °Read and follow directions on labels of laundry or clothing items and detergent. In general, wash and dry with the warmest temperatures recommended on the   label. ° °Clean all areas the individual has used often °Clean all touchable surfaces, such as counters, tabletops, doorknobs, bathroom fixtures, toilets, phones, keyboards, tablets, and bedside tables,  every day. Also, clean any surfaces that may have blood, body fluids, and/or secretions or excretions on them. °Wear gloves when cleaning surfaces the patient has come in contact with. °Use a diluted bleach solution (e.g., dilute bleach with 1 part bleach and 10 parts water) or a household disinfectant with a label that says EPA-registered for coronaviruses. To make a bleach solution at home, add 1 tablespoon of bleach to 1 quart (4 cups) of water. For a larger supply, add ¼ cup of bleach to 1 gallon (16 cups) of water. °Read labels of cleaning products and follow recommendations provided on product labels. Labels contain instructions for safe and effective use of the cleaning product including precautions you should take when applying the product, such as wearing gloves or eye protection and making sure you have good ventilation during use of the product. °Remove gloves and wash hands immediately after cleaning. ° °Monitor yourself for signs and symptoms of illness °Caregivers and household members are considered close contacts, should monitor their health, and will be asked to limit °movement outside of the home to the extent possible. Follow the monitoring steps for close contacts listed on the °symptom monitoring form. ° ° °? If you have additional questions, contact your local health department or call the epidemiologist on call at [phone] °(available 24/7). °? This guidance is subject to change. For the most up-to-date guidance from CDC, please refer to their website: °https://www.cdc.gov/coronavirus/2019-ncov/hcp/guidance-prevent-spread.html  ° °

## 2018-12-07 NOTE — ED Triage Notes (Signed)
Cough productive of green mucous for 3rd day now. Slight sore throat.  Pt sts she thinks her throat is sore from coughing. Has not been around anyone else with these sx.  She tried to go back to work today but sts her employer wanted her to get checked first.

## 2018-12-07 NOTE — ED Provider Notes (Signed)
[facility] EMERGENCY DEPARTMENT Provider Note   CSN: [id number] Arrival date & time: 12/07/18  1542    History   Chief Complaint Chief Complaint  Patient presents with  . Cough    HPI Alicia Rodriguez is a 28 y.o. female.     The history is provided by the patient.  URI Presenting symptoms: cough and sore throat   Presenting symptoms: no ear pain and no fever   Severity:  Mild Onset quality:  Gradual Timing:  Intermittent Progression:  Waxing and waning Chronicity:  New Relieved by:  Nothing Worsened by:  Nothing Associated symptoms: no arthralgias, no headaches, no myalgias, no neck pain, no sinus pain, no sneezing, no swollen glands and no wheezing   Risk factors: no recent illness and no sick contacts     Past Medical History:  Diagnosis Date  . No pertinent past medical history     There are no active problems to display for this patient.   Past Surgical History:  Procedure Laterality Date  . TONSILLECTOMY       OB History    Gravida  0   Para      Term      Preterm      AB      Living        SAB      TAB      Ectopic      Multiple      Live Births               Home Medications    Prior to Admission medications   Medication Sig Start Date End Date Taking? Authorizing Provider  amoxicillin (AMOXIL) 500 MG capsule Take 1 capsule (500 mg total) by mouth 3 (three) times daily. 04/11/18   Janne NapoleonNeese, Hope M, NP  benzonatate (TESSALON) 100 MG capsule Take 1 capsule (100 mg total) by mouth every 8 (eight) hours. 02/21/18   Petrucelli, Samantha R, PA-C  fluticasone (FLONASE) 50 MCG/ACT nasal spray Place 1 spray into both nostrils daily. 02/21/18   Petrucelli, Samantha R, PA-C  ibuprofen (ADVIL,MOTRIN) 800 MG tablet Take 1 tablet (800 mg total) by mouth every 8 (eight) hours as needed. 02/21/18   Petrucelli, Samantha R, PA-C  naproxen (NAPROSYN) 375 MG tablet Take 1 tablet (375 mg total) by mouth 2 (two) times daily. 04/11/18   Janne NapoleonNeese,  Hope M, NP  omeprazole (PRILOSEC) 20 MG capsule Take 1 capsule (20 mg total) by mouth daily. 12/20/17   Little, Ambrose Finlandachel Morgan, MD    Family History Family History  Problem Relation Age of Onset  . Other Neg Hx     Social History Social History   Tobacco Use  . Smoking status: Current Some Day Smoker    Packs/day: 0.50    Types: Cigarettes  . Smokeless tobacco: Never Used  Substance Use Topics  . Alcohol use: Yes    Comment: occ  . Drug use: No     Allergies   Patient has no known allergies.   Review of Systems Review of Systems  Constitutional: Negative for chills and fever.  HENT: Positive for sinus pressure and sore throat. Negative for ear pain, sinus pain and sneezing.   Eyes: Negative for pain and visual disturbance.  Respiratory: Positive for cough. Negative for shortness of breath and wheezing.   Cardiovascular: Negative for chest pain and palpitations.  Gastrointestinal: Negative for abdominal pain and vomiting.  Genitourinary: Negative for dysuria and hematuria.  Musculoskeletal: Negative for  arthralgias, back pain, myalgias and neck pain.  Skin: Negative for color change and rash.  Neurological: Negative for seizures, syncope and headaches.  All other systems reviewed and are negative.    Physical Exam Updated Vital Signs  ED Triage Vitals  Enc Vitals Group     BP 12/07/18 1556 (!) 141/104     Pulse Rate 12/07/18 1556 (!) 110     Resp 12/07/18 1556 14     Temp 12/07/18 1556 99.9 F (37.7 C)     Temp Source 12/07/18 1556 Oral     SpO2 12/07/18 1556 100 %     Weight 12/07/18 1557 145 lb 8 oz (66 kg)     Height 12/07/18 1557 5\' 2"  (1.575 m)     Head Circumference --      Peak Flow --      Pain Score 12/07/18 1557 0     Pain Loc --      Pain Edu? --      Excl. in [place]? --     Physical Exam Vitals signs and nursing note reviewed.  Constitutional:      General: She is not in acute distress.    Appearance: She is well-developed.  HENT:     Head:  Normocephalic and atraumatic.     Nose: Nose normal.     Mouth/Throat:     Mouth: Mucous membranes are moist.     Pharynx: No oropharyngeal exudate or posterior oropharyngeal erythema.     Comments: No redness or abscess to the back of her throat Eyes:     Extraocular Movements: Extraocular movements intact.     Conjunctiva/sclera: Conjunctivae normal.     Pupils: Pupils are equal, round, and reactive to light.  Neck:     Musculoskeletal: Normal range of motion and neck supple.  Cardiovascular:     Rate and Rhythm: Normal rate and regular rhythm.     Pulses: Normal pulses.     Heart sounds: Normal heart sounds. No murmur.  Pulmonary:     Effort: Pulmonary effort is normal. No respiratory distress.     Breath sounds: Normal breath sounds.  Abdominal:     Palpations: Abdomen is soft.     Tenderness: There is no abdominal tenderness.  Skin:    General: Skin is warm and dry.  Neurological:     Mental Status: She is alert.      ED Treatments / Results  Labs (all labs ordered are listed, but only abnormal results are displayed) Labs Reviewed  NOVEL CORONAVIRUS, NAA (HOSPITAL ORDER, SEND-OUT TO REF LAB)    EKG None  Radiology No results found.  Procedures Procedures (including critical care time)  Medications Ordered in ED Medications - No data to display   Initial Impression / Assessment and Plan / ED Course  I have reviewed the triage vital signs and the nursing notes.  Pertinent labs & imaging results that were available during my care of the patient were reviewed by me and considered in my medical decision making (see chart for details).     Alicia Rodriguez is a 28 year old female who presents to the ED with cough, URI.  Patient here for coronavirus testing.  Possibly some low-grade fevers at home.  She has had mostly coughing.  No shortness of breath.  She states that her throat hurts when she coughs but does not have any signs of throat infection on exam.   Doubt strep.  Likely URI.  She is mostly asymptomatic.  However, states that her employer told her to come get tested for coronavirus.  Patient with low-grade temperature here.  Clear breath sounds.  No signs of respiratory distress.  COVID testing was sent for.  Given instructions on home isolation until she has results.  Given return precautions and discharged in ED in good condition.  Alicia Danella PentonS Michaelis was evaluated in Emergency Department on 12/07/2018 for the symptoms described in the history of present illness. She was evaluated in the context of the global COVID-19 pandemic, which necessitated consideration that the patient might be at risk for infection with the SARS-CoV-2 virus that causes COVID-19. Institutional protocols and algorithms that pertain to the evaluation of patients at risk for COVID-19 are in a state of rapid change based on information released by regulatory bodies including the CDC and federal and state organizations. These policies and algorithms were followed during the patient's care in the ED.  This chart was dictated using voice recognition software.  Despite best efforts to proofread,  errors can occur which can change the documentation meaning.    Final Clinical Impressions(s) / ED Diagnoses   Final diagnoses:  Cough    ED Discharge Orders    None       Virgina NorfolkCuratolo, Kemper Heupel, DO 12/07/18 1608

## 2018-12-08 LAB — NOVEL CORONAVIRUS, NAA (HOSP ORDER, SEND-OUT TO REF LAB; TAT 18-24 HRS): SARS-CoV-2, NAA: NOT DETECTED

## 2019-01-11 IMAGING — CT CT HEAD W/O CM
2 of 3 series · 14 of 47 positions shown, 17 images · non-contrast
Comparison: 08/12/2008

CLINICAL DATA: Two days of gradually worsening BILATERAL sharp
throbbing headache with associated lightheadedness with standing and
photophobia, 1 week of rhinorrhea with fever 3 days ago, smoker

EXAM:
CT HEAD WITHOUT CONTRAST
TECHNIQUE: Contiguous axial images were obtained from the base of the skull
through the vertex without intravenous contrast. Sagittal and
coronal MPR images reconstructed from axial data set.

[Series 2: head wo · axial · 0.41mm/px · z∈[-163,-38]mm · 11 of 30 slices shown, 14 images]
[im 3/30  brain]
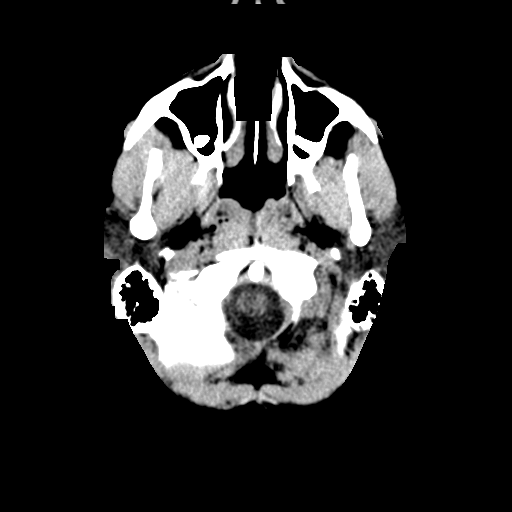
[im 3/30  bone]
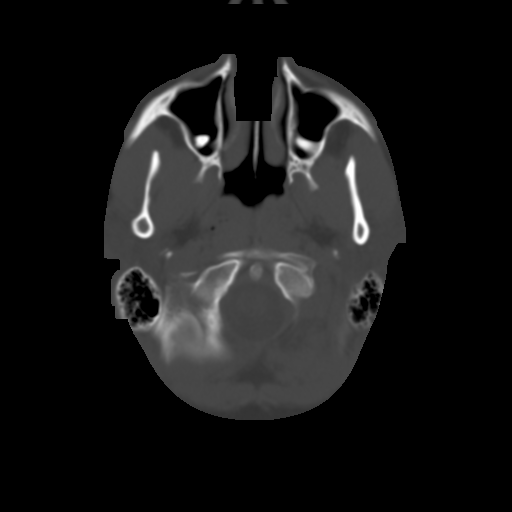
[im 5/30  brain]
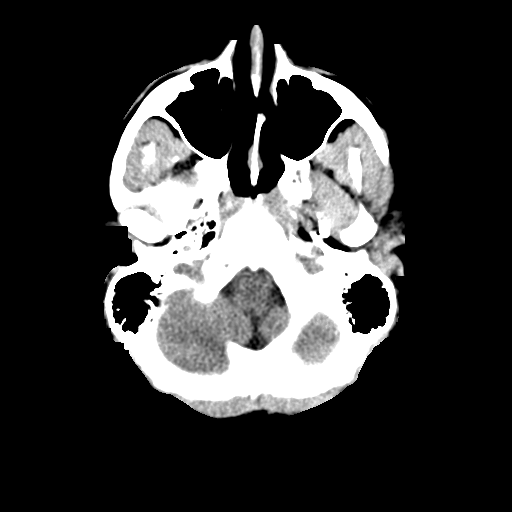
[im 8/30  brain]
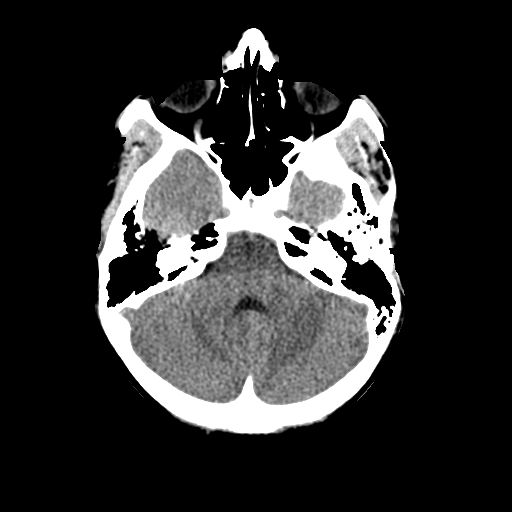
[im 10/30  brain]
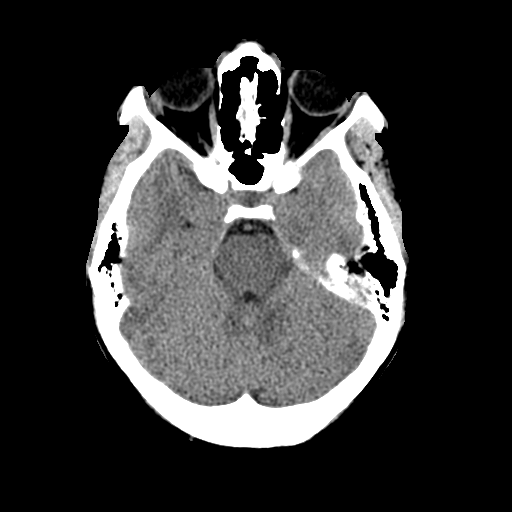
[im 13/30  brain]
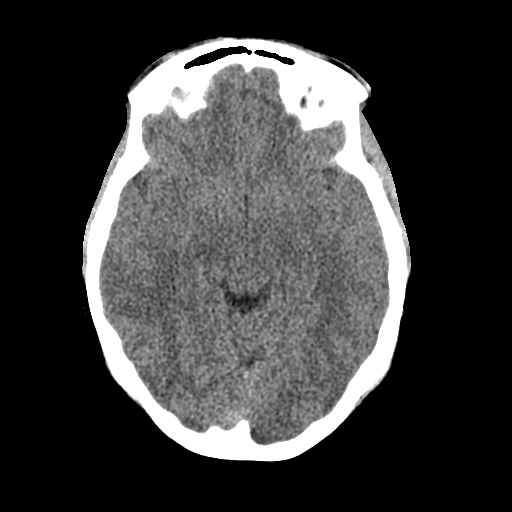
[im 13/30  bone]
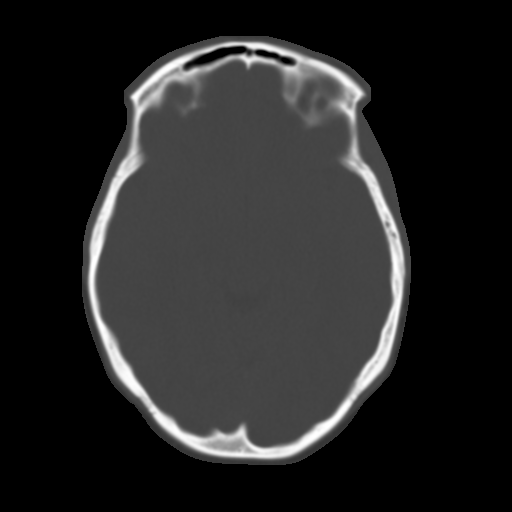
[im 16/30  brain]
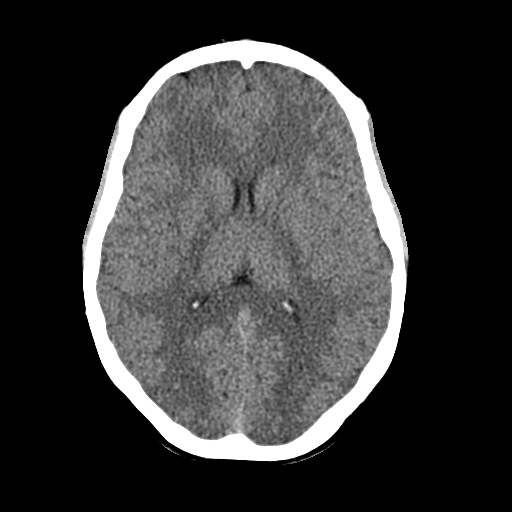
[im 18/30  brain]
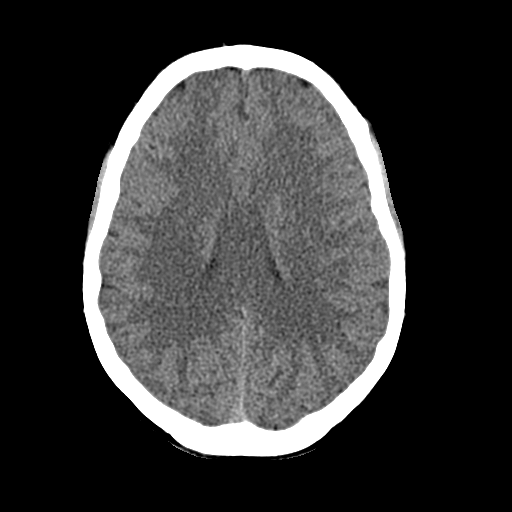
[im 21/30  brain]
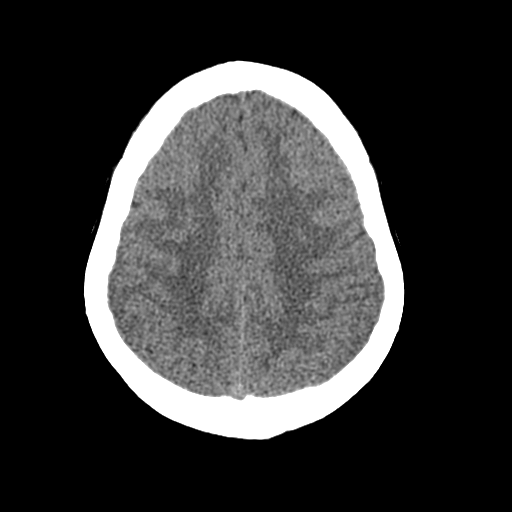
[im 23/30  brain]
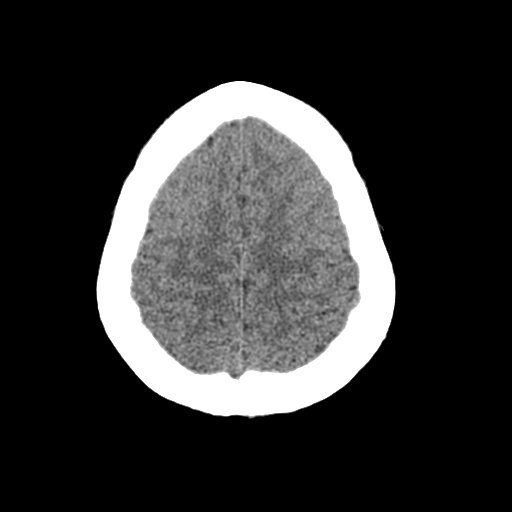
[im 23/30  bone]
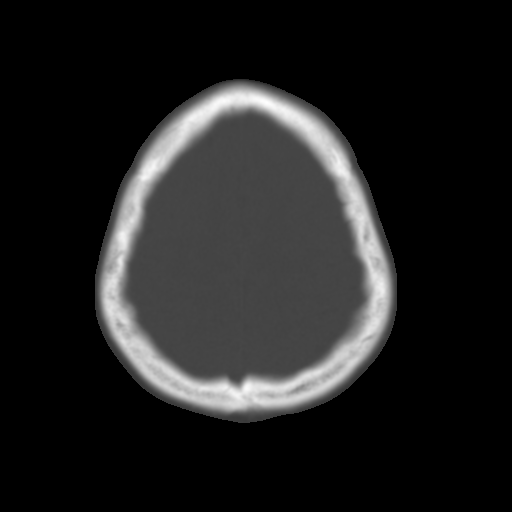
[im 26/30  brain]
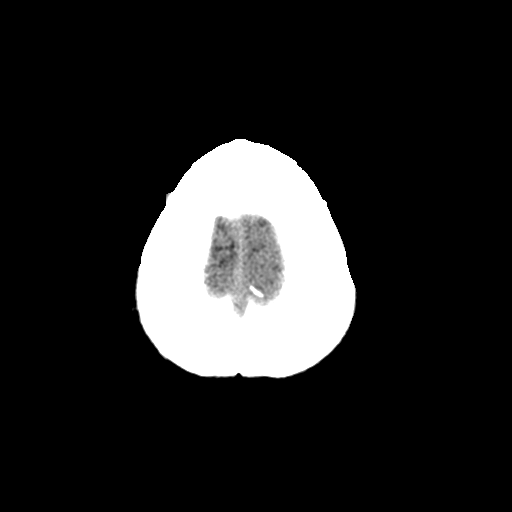
[im 28/30  brain]
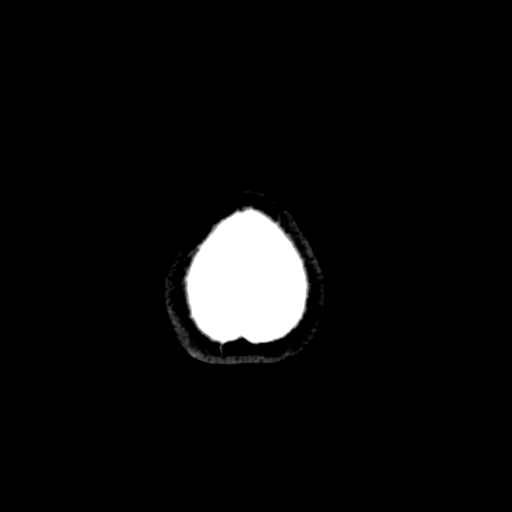

[Series 4: cor soft · coronal · 0.29mm/px · 3 of 84 slices shown]
[im 28/84  brain]
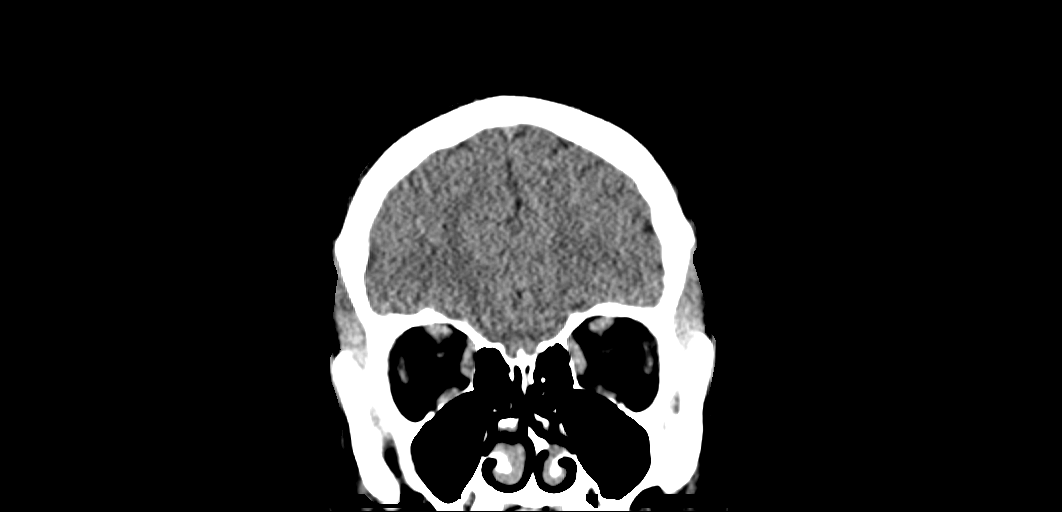
[im 37/84  brain]
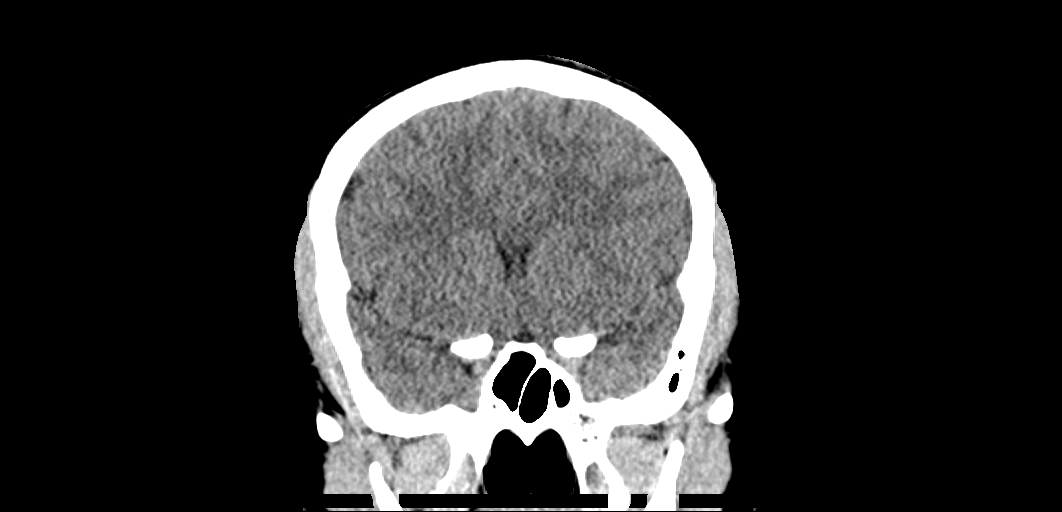
[im 47/84  brain]
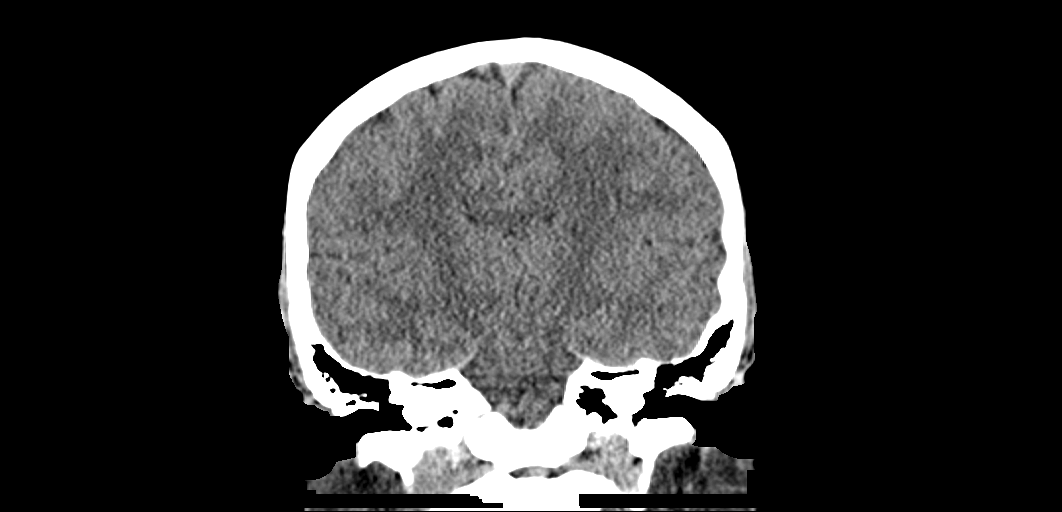

[14 of 47 positions shown; findings below may reference images not displayed]

FINDINGS: Brain: Normal ventricular morphology. No midline shift or mass
effect. Normal appearance of brain parenchyma. No intracranial
hemorrhage, mass lesion, evidence of acute infarction, or
extra-axial fluid collection.

Vascular: Unremarkable

Skull: Intact

Sinuses/Orbits: Clear

Other: N/A
IMPRESSION: Normal exam.

## 2019-04-04 ENCOUNTER — Other Ambulatory Visit: Payer: Self-pay

## 2019-04-04 ENCOUNTER — Emergency Department (HOSPITAL_BASED_OUTPATIENT_CLINIC_OR_DEPARTMENT_OTHER): Payer: Self-pay

## 2019-04-04 ENCOUNTER — Emergency Department (HOSPITAL_BASED_OUTPATIENT_CLINIC_OR_DEPARTMENT_OTHER)
Admission: EM | Admit: 2019-04-04 | Discharge: 2019-04-04 | Disposition: A | Discharge status: Home / Self Care | Payer: Self-pay | Attending: Emergency Medicine | Admitting: Emergency Medicine

## 2019-04-04 ENCOUNTER — Encounter (HOSPITAL_BASED_OUTPATIENT_CLINIC_OR_DEPARTMENT_OTHER): Payer: Self-pay | Admitting: *Deleted

## 2019-04-04 DIAGNOSIS — Z20828 Contact with and (suspected) exposure to other viral communicable diseases: Secondary | ICD-10-CM | POA: Insufficient documentation

## 2019-04-04 DIAGNOSIS — R197 Diarrhea, unspecified: Secondary | ICD-10-CM | POA: Insufficient documentation

## 2019-04-04 DIAGNOSIS — J069 Acute upper respiratory infection, unspecified: Secondary | ICD-10-CM | POA: Insufficient documentation

## 2019-04-04 DIAGNOSIS — F17210 Nicotine dependence, cigarettes, uncomplicated: Secondary | ICD-10-CM | POA: Insufficient documentation

## 2019-04-04 LAB — PREGNANCY, URINE: Preg Test, Ur: NEGATIVE

## 2019-04-04 MED ORDER — BENZONATATE 100 MG PO CAPS: 100.0000 mg | ORAL_CAPSULE | Freq: Three times a day (TID) | ORAL | 0 refills | Status: DC | PRN

## 2019-04-04 MED ORDER — ALBUTEROL SULFATE HFA 108 (90 BASE) MCG/ACT IN AERS: 1.0000 | INHALATION_SPRAY | Freq: Four times a day (QID) | RESPIRATORY_TRACT | 0 refills | Status: DC | PRN

## 2019-04-04 NOTE — ED Triage Notes (Signed)
Cough and congestion x 2 weeks. Diarrhea today.

## 2019-04-04 NOTE — ED Provider Notes (Signed)
Emergency Department Provider Note   I have reviewed the triage vital signs and the nursing notes.   HISTORY  Chief Complaint Cough and Diarrhea   HPI Alicia Rodriguez is a 28 y.o. female presents to the emergency department with cough, congestion, diarrhea over the past 2 weeks.  Patient describes cough congestion symptoms primarily but several days ago developed nonbloody diarrhea without abdominal pain.  She denies chest pain or shortness of breath.  No fevers, chills, or body aches.  She denies sore throat.  She has lost her sense of smell but attributes this to her increased congestion.  She has not had contact with known COVID-19 positive individuals.  She denies any UTI symptoms.  She has not been on recent antibiotics or traveled.    Past Medical History:  Diagnosis Date  . No pertinent past medical history     There are no active problems to display for this patient.   Past Surgical History:  Procedure Laterality Date  . TONSILLECTOMY      Allergies Patient has no known allergies.  Family History  Problem Relation Age of Onset  . Other Neg Hx     Social History Social History   Tobacco Use  . Smoking status: Current Some Day Smoker    Packs/day: 0.50    Types: Cigarettes  . Smokeless tobacco: Never Used  Substance Use Topics  . Alcohol use: Yes    Comment: occ  . Drug use: No    Review of Systems  Constitutional: No fever/chills Eyes: No visual changes. ENT: No sore throat. Positive nasal congestion.  Cardiovascular: Denies chest pain. Respiratory: Denies shortness of breath. Positive cough.  Gastrointestinal: No abdominal pain.  No nausea, no vomiting. Positive diarrhea.  No constipation. Genitourinary: Negative for dysuria. Musculoskeletal: Negative for back pain. Skin: Negative for rash. Neurological: Negative for headaches, focal weakness or numbness.  10-point ROS otherwise negative.  ____________________________________________    PHYSICAL EXAM:  VITAL SIGNS: ED Triage Vitals  Enc Vitals Group     BP 04/04/19 1504 140/90     Pulse Rate 04/04/19 1504 100     Resp 04/04/19 1504 20     Temp 04/04/19 1504 98.6 F (37 C)     Temp Source 04/04/19 1504 Oral     SpO2 04/04/19 1504 100 %     Weight 04/04/19 1459 150 lb (68 kg)     Height 04/04/19 1459 5\' 2"  (1.575 m)   Constitutional: Alert and oriented. Well appearing and in no acute distress. Eyes: Conjunctivae are normal.  Head: Atraumatic. Nose: No congestion/rhinnorhea. Mouth/Throat: Mucous membranes are moist. Neck: No stridor. Cardiovascular: Normal rate, regular rhythm. Good peripheral circulation. Grossly normal heart sounds.   Respiratory: Normal respiratory effort.  No retractions. Lungs CTAB. Gastrointestinal: No distention.  Musculoskeletal: No gross deformities of extremities. Neurologic:  Normal speech and language. Skin:  Skin is warm, dry and intact. No rash noted.   ____________________________________________   LABS (all labs ordered are listed, but only abnormal results are displayed)  Labs Reviewed  NOVEL CORONAVIRUS, NAA (HOSP ORDER, SEND-OUT TO REF LAB; TAT 18-24 HRS)  PREGNANCY, URINE   ____________________________________________  RADIOLOGY  Dg Chest Portable 1 View  Result Date: 04/04/2019 CLINICAL DATA:  Cough and chest congestion for 2 weeks. EXAM: PORTABLE CHEST 1 VIEW COMPARISON:  03/03/2018 FINDINGS: The heart size and mediastinal contours are within normal limits. Both lungs are clear. The visualized skeletal structures are unremarkable. IMPRESSION: Negative.  No active disease. Electronically Signed  By: Marlaine Hind M.D.   On: 04/04/2019 15:39    ____________________________________________   PROCEDURES  Procedure(s) performed:   Procedures  None  ____________________________________________   INITIAL IMPRESSION / ASSESSMENT AND PLAN / ED COURSE  Pertinent labs & imaging results that were available  during my care of the patient were reviewed by me and considered in my medical decision making (see chart for details).   Patient presents to the emergency department with cough, congestion, diarrhea.  No hypoxemia or other abnormal vital signs on arrival to the emergency department.  She is well-appearing without shortness of breath.  No blood in the diarrhea or abdominal pain.  No recent antibiotics to suspect C. Difficile.  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  Considering possible COVID-19 infection and do plan to send a test from the emergency department.  With 2 weeks of cough/congestion I do also plan to obtain a chest x-ray to evaluate for pneumonia.   03:45 PM  Chest x-ray reviewed with no infiltrate.  Plan for symptom management and PCP referral.  Patient to stay out of work and isolated at home.  She will follow results of her COVID-19 test on MyChart. Discussed ED return precautions.   Alicia Rodriguez was evaluated in Emergency Department on 04/04/2019 for the symptoms described in the history of present illness. She was evaluated in the context of the global COVID-19 pandemic, which necessitated consideration that the patient might be at risk for infection with the SARS-CoV-2 virus that causes COVID-19. Institutional protocols and algorithms that pertain to the evaluation of patients at risk for COVID-19 are in a state of rapid change based on information released by regulatory bodies including the CDC and federal and state organizations. These policies and algorithms were followed during the patient's care in the ED.   ____________________________________________  FINAL CLINICAL IMPRESSION(S) / ED DIAGNOSES  Final diagnoses:  Viral upper respiratory tract infection  Diarrhea of presumed infectious origin    NEW OUTPATIENT MEDICATIONS STARTED DURING THIS VISIT:  New Prescriptions   ALBUTEROL (VENTOLIN HFA) 108 (90 BASE) MCG/ACT INHALER    Inhale 1-2 puffs into the  lungs every 6 (six) hours as needed for wheezing or shortness of breath.   BENZONATATE (TESSALON) 100 MG CAPSULE    Take 1 capsule (100 mg total) by mouth 3 (three) times daily as needed for cough.    Note:  This document was prepared using Dragon voice recognition software and may include unintentional dictation errors.  Nanda Quinton, MD, [facility] Emergency Medicine    Jesscia Imm, Wonda Olds, MD 04/04/19 [phone]

## 2019-04-04 NOTE — Discharge Instructions (Signed)
He was seen in the emergency department today with upper respiratory tract infection symptoms and diarrhea.  I am testing you for COVID-19.  Please stay isolated at home and away from work until your test results come back.  You can follow the results on MyChart.  Running out of work for 1 week but you will require up to 10 days off work if your COVID-19 test is positive.  You must be completely asymptomatic and without fever for 3 days prior to returning to work.  Please return to the emergency department if you develop new or suddenly worsening symptoms.

## 2019-04-05 LAB — NOVEL CORONAVIRUS, NAA (HOSP ORDER, SEND-OUT TO REF LAB; TAT 18-24 HRS): SARS-CoV-2, NAA: NOT DETECTED

## 2019-06-12 ENCOUNTER — Other Ambulatory Visit: Payer: Self-pay

## 2019-12-04 ENCOUNTER — Ambulatory Visit: Payer: Self-pay

## 2019-12-19 ENCOUNTER — Encounter: Payer: Self-pay | Admitting: Student

## 2020-01-03 ENCOUNTER — Ambulatory Visit
Admission: EM | Admit: 2020-01-03 | Discharge: 2020-01-03 | Disposition: A | Discharge status: Home / Self Care | Payer: Self-pay | Attending: Family Medicine | Admitting: Family Medicine

## 2020-01-03 DIAGNOSIS — K529 Noninfective gastroenteritis and colitis, unspecified: Secondary | ICD-10-CM

## 2020-01-03 DIAGNOSIS — K2100 Gastro-esophageal reflux disease with esophagitis, without bleeding: Secondary | ICD-10-CM

## 2020-01-03 DIAGNOSIS — R112 Nausea with vomiting, unspecified: Secondary | ICD-10-CM

## 2020-01-03 DIAGNOSIS — R197 Diarrhea, unspecified: Secondary | ICD-10-CM

## 2020-01-03 MED ORDER — OMEPRAZOLE 20 MG PO CPDR: 20.0000 mg | DELAYED_RELEASE_CAPSULE | Freq: Every day | ORAL | 0 refills | Status: DC

## 2020-01-03 MED ORDER — ONDANSETRON HCL 4 MG PO TABS: 4.0000 mg | ORAL_TABLET | Freq: Four times a day (QID) | ORAL | 0 refills | Status: DC

## 2020-01-03 NOTE — ED Triage Notes (Signed)
Pt c/o RUQ pain since yesterday, now having n/v/d, weakness and headaches.

## 2020-01-03 NOTE — ED Provider Notes (Signed)
[facility]   [id number] 01/03/20 Arrival Time: 1420  CC: ABDOMINAL PAIN  SUBJECTIVE:  Alicia Rodriguez is a 29 y.o. female who presents with complaint of abdominal discomfort that began abruptly yesterday. Reports that she has been experiencing RUQ pain and burning at the epigastrum. Denies a precipitating event, trauma, close contacts with similar symptoms, recent travel or antibiotic use. Described as intermittent and burning in character.  Has not taken OTC medications for this. Denies alleviating or aggravating factors. Denies similar symptoms in the past. Last BM today.    Denies fever, chills, appetite changes, weight changes, nausea, vomiting, chest pain, SOB, diarrhea, constipation, hematochezia, melena, dysuria, difficulty urinating, increased frequency or urgency, flank pain, loss of bowel or bladder function, vaginal discharge, vaginal odor, vaginal bleeding, dyspareunia, pelvic pain.     Patient's last menstrual period was 12/13/2019.  ROS: As per HPI.  All other pertinent ROS negative.     Past Medical History:  Diagnosis Date  . No pertinent past medical history    Past Surgical History:  Procedure Laterality Date  . TONSILLECTOMY     No Known Allergies No current facility-administered medications on file prior to encounter.   No current outpatient medications on file prior to encounter.   Social History   Socioeconomic History  . Marital status: Single    Spouse name: Not on file  . Number of children: Not on file  . Years of education: Not on file  . Highest education level: Not on file  Occupational History  . Not on file  Tobacco Use  . Smoking status: Former Smoker    Packs/day: 0.50    Types: Cigarettes  . Smokeless tobacco: Never Used  Substance and Sexual Activity  . Alcohol use: Yes    Comment: occ  . Drug use: No  . Sexual activity: Not on file  Other Topics Concern  . Not on file  Social History Narrative  . Not on file    Social Determinants of Health   Financial Resource Strain:   . Difficulty of Paying Living Expenses:   Food Insecurity:   . Worried About Programme researcher, broadcasting/film/video in the Last Year:   . Barista in the Last Year:   Transportation Needs:   . Freight forwarder (Medical):   Marland Kitchen Lack of Transportation (Non-Medical):   Physical Activity:   . Days of Exercise per Week:   . Minutes of Exercise per Session:   Stress:   . Feeling of Stress :   Social Connections:   . Frequency of Communication with Friends and Family:   . Frequency of Social Gatherings with Friends and Family:   . Attends Religious Services:   . Active Member of Clubs or Organizations:   . Attends Banker Meetings:   Marland Kitchen Marital Status:   Intimate Partner Violence:   . Fear of Current or Ex-Partner:   . Emotionally Abused:   Marland Kitchen Physically Abused:   . Sexually Abused:    Family History  Problem Relation Age of Onset  . Other Neg Hx      OBJECTIVE:  Vitals:   01/03/20 1450  BP: (!) 136/112  Pulse: 97  Resp: 18  Temp: 98.7 F (37.1 C)  TempSrc: Oral  SpO2: 98%    General appearance: Alert; NAD HEENT: NCAT.  Oropharynx clear.  Lungs: clear to auscultation bilaterally without adventitious breath sounds Heart: regular rate and rhythm.  Radial pulses 2+ symmetrical bilaterally Abdomen: soft, non-distended;  normal active bowel sounds; non-tender to light and deep palpation; nontender at McBurney's point; negative Murphy's sign; negative rebound; no guarding Back: no CVA tenderness Extremities: no edema; symmetrical with no gross deformities Skin: warm and dry Neurologic: normal gait Psychological: alert and cooperative; normal mood and affect  LABS: No results found for this or any previous visit (from the past 24 hour(s)).  DIAGNOSTIC STUDIES: No results found.   ASSESSMENT & PLAN:  1. Noninfectious gastroenteritis, unspecified type   2. Nausea and vomiting, intractability of  vomiting not specified, unspecified vomiting type   3. Diarrhea, unspecified type   4. Gastroesophageal reflux disease with esophagitis without hemorrhage     Meds ordered this encounter  Medications  . ondansetron (ZOFRAN) 4 MG tablet    Sig: Take 1 tablet (4 mg total) by mouth every 6 (six) hours.    Dispense:  12 tablet    Refill:  0    Order Specific Question:   Supervising Provider    Answer:   Merrilee Jansky [id number]  . omeprazole (PRILOSEC) 20 MG capsule    Sig: Take 1 capsule (20 mg total) by mouth daily.    Dispense:  30 capsule    Refill:  0    Order Specific Question:   Supervising Provider    Answer:   Merrilee Jansky [id number]     Get rest and drink fluids Zofran prescribed.  Take as directed.   Prescribed omeprazole  DIET Instructions:  30 minutes after taking nausea medicine, begin with sips of clear liquids. If able to hold down 2 - 4 ounces for 30 minutes, begin drinking more. Increase your fluid intake to replace losses. Clear liquids only for 24 hours (water, tea, sport drinks, clear flat ginger ale or cola and juices, broth, jello, popsicles, ect). Advance to bland foods, applesauce, rice, baked or boiled chicken, ect. Avoid milk, greasy foods and anything that doesn't agree with you.  If you experience new or worsening symptoms return or go to ER such as fever, chills, nausea, vomiting, diarrhea, bloody or dark tarry stools, constipation, urinary symptoms, worsening abdominal discomfort, symptoms that do not improve with medications, inability to keep fluids down.  Reviewed expectations re: course of current medical issues. Questions answered. Outlined signs and symptoms indicating need for more acute intervention. Patient verbalized understanding. After Visit Summary given.   Moshe Cipro, NP 01/05/20 [phone]

## 2020-01-03 NOTE — Discharge Instructions (Signed)
I have sent in zofran to your pharmacy  I have also sent in omeprazole for you to take for reflux once you are able to tolerate liquids  Follow up with this office or with primary care as needed  Follow up with the ER for inability to keep down foods or liquids, high fever, other concerning symptoms

## 2020-01-21 ENCOUNTER — Encounter: Payer: Self-pay | Admitting: Emergency Medicine

## 2020-01-21 ENCOUNTER — Ambulatory Visit
Admission: EM | Admit: 2020-01-21 | Discharge: 2020-01-21 | Disposition: A | Discharge status: Home / Self Care | Payer: Self-pay | Attending: Physician Assistant | Admitting: Physician Assistant

## 2020-01-21 ENCOUNTER — Other Ambulatory Visit: Payer: Self-pay

## 2020-01-21 DIAGNOSIS — Z1152 Encounter for screening for COVID-19: Secondary | ICD-10-CM

## 2020-01-21 DIAGNOSIS — Z20822 Contact with and (suspected) exposure to covid-19: Secondary | ICD-10-CM

## 2020-01-21 NOTE — Discharge Instructions (Addendum)
COVID testing ordered. As discussed, given recent exposure without symptoms, you may still be in incubation period. Ideally would like you to quarantine for 14 days. Monitor for any symptoms such as cough, congestion, shortness of breath, loss of taste/smell, fever, to start self quarantine and may need retesting. Go to the emergency department for further evaluation if you develop significant shortness of breath, cannot speak in full sentences.

## 2020-01-21 NOTE — ED Triage Notes (Signed)
Pt here for covid exposure last Saturday, Monday and Wednesday; denies sx

## 2020-01-21 NOTE — ED Provider Notes (Signed)
[facility]    CSN: [id number] Arrival date & time: 01/21/20  1109      History   Chief Complaint Chief Complaint  Patient presents with  . Covid Exposure    HPI Alicia Rodriguez is a 29 y.o. female.   29 year old female comes in for COVID testing after positive exposure multiple times throughout the past week, last exposure 5 days ago. Patient remains asymptomatic. Denies URI symptoms such as cough, congestion, sore throat. Denies fever, chills, body aches. Denies abdominal pain, nausea, vomiting, diarrhea. Denies shortness of breath, loss of taste/smell. No antipyretics in the last 8 hours.       Past Medical History:  Diagnosis Date  . No pertinent past medical history     There are no problems to display for this patient.   Past Surgical History:  Procedure Laterality Date  . TONSILLECTOMY      OB History    Gravida  0   Para      Term      Preterm      AB      Living        SAB      TAB      Ectopic      Multiple      Live Births               Home Medications    Prior to Admission medications   Medication Sig Start Date End Date Taking? Authorizing Provider  omeprazole (PRILOSEC) 20 MG capsule Take 1 capsule (20 mg total) by mouth daily. 01/03/20   Moshe Cipro, NP  ondansetron (ZOFRAN) 4 MG tablet Take 1 tablet (4 mg total) by mouth every 6 (six) hours. 01/03/20   Moshe Cipro, NP    Family History Family History  Problem Relation Age of Onset  . Hyperlipidemia Mother   . Hypertension Mother   . Diabetes Mother   . Other Neg Hx     Social History Social History   Tobacco Use  . Smoking status: Former Smoker    Packs/day: 0.50    Types: Cigarettes  . Smokeless tobacco: Never Used  Substance Use Topics  . Alcohol use: Yes    Comment: occ  . Drug use: No     Allergies   Patient has no known allergies.   Review of Systems Review of Systems  Reason unable to perform ROS: See HPI as  above.     Physical Exam Triage Vital Signs ED Triage Vitals  Enc Vitals Group     BP 01/21/20 1136 134/87     Pulse Rate 01/21/20 1136 100     Resp 01/21/20 1136 18     Temp 01/21/20 1136 98.6 F (37 C)     Temp Source 01/21/20 1136 Oral     SpO2 01/21/20 1136 97 %     Weight --      Height --      Head Circumference --      Peak Flow --      Pain Score 01/21/20 1137 0     Pain Loc --      Pain Edu? --      Excl. in GC? --    No data found.  Updated Vital Signs BP 134/87 (BP Location: Left Arm)   Pulse 100   Temp 98.6 F (37 C) (Oral)   Resp 18   SpO2 97%   Physical Exam Constitutional:  General: She is not in acute distress.    Appearance: Normal appearance. She is well-developed. She is not toxic-appearing or diaphoretic.  HENT:     Head: Normocephalic and atraumatic.  Eyes:     Conjunctiva/sclera: Conjunctivae normal.     Pupils: Pupils are equal, round, and reactive to light.  Pulmonary:     Effort: Pulmonary effort is normal. No respiratory distress.  Musculoskeletal:     Cervical back: Normal range of motion and neck supple.  Skin:    General: Skin is warm and dry.  Neurological:     Mental Status: She is alert and oriented to person, place, and time.      UC Treatments / Results  Labs (all labs ordered are listed, but only abnormal results are displayed) Labs Reviewed  NOVEL CORONAVIRUS, NAA    EKG   Radiology No results found.  Procedures Procedures (including critical care time)  Medications Ordered in UC Medications - No data to display  Initial Impression / Assessment and Plan / UC Course  I have reviewed the triage vital signs and the nursing notes.  Pertinent labs & imaging results that were available during my care of the patient were reviewed by me and considered in my medical decision making (see chart for details).    Discussed with patient, given positive exposure without symptoms, could still be within incubation  period. If develop symptoms, may need retesting.  Patient expresses understanding and would like to proceed with testing.  COVID testing ordered.  Patient will continue to monitor symptoms.  To self quarantine if develop any symptoms.  Return precautions given.  Final Clinical Impressions(s) / UC Diagnoses   Final diagnoses:  Encounter for screening for COVID-19  Exposure to COVID-19 virus   ED Prescriptions    None     PDMP not reviewed this encounter.   Belinda Fisher, PA-C 01/21/20 1156

## 2020-01-22 LAB — NOVEL CORONAVIRUS, NAA: SARS-CoV-2, NAA: NOT DETECTED

## 2020-01-22 LAB — SARS-COV-2, NAA 2 DAY TAT

## 2020-06-13 ENCOUNTER — Other Ambulatory Visit: Payer: Self-pay

## 2020-06-13 ENCOUNTER — Ambulatory Visit: Admission: EM | Admit: 2020-06-13 | Discharge: 2020-06-13 | Discharge status: Against Medical Advice | Payer: Self-pay

## 2020-06-13 NOTE — ED Notes (Signed)
Pt was at home and said she could not wait

## 2020-06-14 ENCOUNTER — Emergency Department (HOSPITAL_BASED_OUTPATIENT_CLINIC_OR_DEPARTMENT_OTHER)
Admission: EM | Admit: 2020-06-14 | Discharge: 2020-06-14 | Disposition: A | Discharge status: Home / Self Care | Payer: Self-pay | Attending: Emergency Medicine | Admitting: Emergency Medicine

## 2020-06-14 ENCOUNTER — Encounter (HOSPITAL_BASED_OUTPATIENT_CLINIC_OR_DEPARTMENT_OTHER): Payer: Self-pay | Admitting: *Deleted

## 2020-06-14 DIAGNOSIS — R509 Fever, unspecified: Secondary | ICD-10-CM | POA: Insufficient documentation

## 2020-06-14 DIAGNOSIS — M791 Myalgia, unspecified site: Secondary | ICD-10-CM | POA: Insufficient documentation

## 2020-06-14 DIAGNOSIS — R070 Pain in throat: Secondary | ICD-10-CM | POA: Insufficient documentation

## 2020-06-14 DIAGNOSIS — Z5321 Procedure and treatment not carried out due to patient leaving prior to being seen by health care provider: Secondary | ICD-10-CM | POA: Insufficient documentation

## 2020-06-14 NOTE — ED Triage Notes (Signed)
Pt reports fever all day yesterday, checked temp last night, reports 102.3, took tylenol, today cont with body aches and sore throat.

## 2020-06-15 ENCOUNTER — Other Ambulatory Visit: Payer: Self-pay

## 2020-06-15 ENCOUNTER — Ambulatory Visit
Admission: EM | Admit: 2020-06-15 | Discharge: 2020-06-15 | Disposition: A | Discharge status: Home / Self Care | Payer: Self-pay | Attending: Emergency Medicine | Admitting: Emergency Medicine

## 2020-06-15 DIAGNOSIS — J069 Acute upper respiratory infection, unspecified: Secondary | ICD-10-CM | POA: Insufficient documentation

## 2020-06-15 DIAGNOSIS — J029 Acute pharyngitis, unspecified: Secondary | ICD-10-CM | POA: Insufficient documentation

## 2020-06-15 LAB — POCT RAPID STREP A (OFFICE): Rapid Strep A Screen: NEGATIVE

## 2020-06-15 MED ORDER — BENZONATATE 100 MG PO CAPS: 100.0000 mg | ORAL_CAPSULE | Freq: Three times a day (TID) | ORAL | 0 refills | Status: DC

## 2020-06-15 MED ORDER — FLUTICASONE PROPIONATE 50 MCG/ACT NA SUSP: 1.0000 | Freq: Every day | NASAL | 0 refills | Status: DC

## 2020-06-15 MED ORDER — CETIRIZINE HCL 10 MG PO TABS: 10.0000 mg | ORAL_TABLET | Freq: Every day | ORAL | 0 refills | Status: DC

## 2020-06-15 NOTE — ED Provider Notes (Signed)
[facility]    CSN: [id number] Arrival date & time: 06/15/20  0825      History   Chief Complaint Chief Complaint  Patient presents with  . Sore Throat  . Diarrhea  . Generalized Body Aches  . Fever    HPI Alicia Rodriguez is a 30 y.o. female  History was provided by the patient. Alicia Rodriguez is a 30 y.o. female who presents for evaluation of a sore throat. Associated symptoms include low grade fevers, sore throat, and aches. Onset of symptoms was 3 days ago, unchanged since that time.  She is drinking plenty of fluids. She has not had recent close exposure to someone with proven streptococcal pharyngitis. The following portions of the patient's history were reviewed and updated as appropriate: allergies, current medications, past family history, past medical history, past social history, past surgical history and problem list.     Past Medical History:  Diagnosis Date  . No pertinent past medical history     There are no problems to display for this patient.   Past Surgical History:  Procedure Laterality Date  . TONSILLECTOMY      OB History    Gravida  0   Para      Term      Preterm      AB      Living        SAB      IAB      Ectopic      Multiple      Live Births               Home Medications    Prior to Admission medications   Medication Sig Start Date End Date Taking? Authorizing Provider  benzonatate (TESSALON) 100 MG capsule Take 1 capsule (100 mg total) by mouth every 8 (eight) hours. 06/15/20  Yes Hall-Potvin, [place], PA-C  cetirizine (ZYRTEC ALLERGY) 10 MG tablet Take 1 tablet (10 mg total) by mouth daily. 06/15/20  Yes Hall-Potvin, [place], PA-C  fluticasone (FLONASE) 50 MCG/ACT nasal spray Place 1 spray into both nostrils daily. 06/15/20  Yes Hall-Potvin, [place], PA-C  omeprazole (PRILOSEC) 20 MG capsule Take 1 capsule (20 mg total) by mouth daily. 01/03/20   Moshe Cipro, NP    Family  History Family History  Problem Relation Age of Onset  . Hyperlipidemia Mother   . Hypertension Mother   . Diabetes Mother   . Other Neg Hx     Social History Social History   Tobacco Use  . Smoking status: Former Smoker    Packs/day: 0.50    Types: Cigarettes  . Smokeless tobacco: Never Used  Substance Use Topics  . Alcohol use: Yes  . Drug use: No     Allergies   Patient has no known allergies.   Review of Systems Review of Systems  Constitutional: Positive for fever. Negative for fatigue.  HENT: Positive for sore throat. Negative for congestion, dental problem, ear pain, facial swelling, hearing loss, sinus pain, trouble swallowing and voice change.   Eyes: Negative for photophobia, pain and visual disturbance.  Respiratory: Negative for cough and shortness of breath.   Cardiovascular: Negative for chest pain and palpitations.  Gastrointestinal: Negative for diarrhea and vomiting.  Musculoskeletal: Positive for myalgias. Negative for arthralgias.  Neurological: Negative for dizziness and headaches.     Physical Exam Triage Vital Signs ED Triage Vitals  Enc Vitals Group     BP 06/15/20 0856 112/81  Pulse Rate 06/15/20 0856 90     Resp 06/15/20 0856 18     Temp 06/15/20 0856 99.1 F (37.3 C)     Temp Source 06/15/20 0856 Oral     SpO2 06/15/20 0856 98 %     Weight --      Height --      Head Circumference --      Peak Flow --      Pain Score 06/15/20 0854 0     Pain Loc --      Pain Edu? --      Excl. in [place]? --    No data found.  Updated Vital Signs BP 112/81 (BP Location: Left Arm)   Pulse 90   Temp 99.1 F (37.3 C) (Oral)   Resp 18   LMP 06/10/2020   SpO2 98%   Visual Acuity Right Eye Distance:   Left Eye Distance:   Bilateral Distance:    Right Eye Near:   Left Eye Near:    Bilateral Near:     Physical Exam Constitutional:      General: She is not in acute distress.    Appearance: She is not ill-appearing or diaphoretic.   HENT:     Head: Normocephalic and atraumatic.     Right Ear: Tympanic membrane and ear canal normal.     Left Ear: Tympanic membrane and ear canal normal.     Mouth/Throat:     Mouth: Mucous membranes are moist.     Pharynx: Oropharynx is clear. No oropharyngeal exudate or posterior oropharyngeal erythema.  Eyes:     General: No scleral icterus.    Conjunctiva/sclera: Conjunctivae normal.     Pupils: Pupils are equal, round, and reactive to light.  Neck:     Comments: Trachea midline, negative JVD Cardiovascular:     Rate and Rhythm: Normal rate and regular rhythm.     Heart sounds: No murmur heard. No gallop.   Pulmonary:     Effort: Pulmonary effort is normal. No respiratory distress.     Breath sounds: No wheezing, rhonchi or rales.  Musculoskeletal:     Cervical back: Neck supple. No tenderness.  Lymphadenopathy:     Cervical: No cervical adenopathy.  Skin:    Capillary Refill: Capillary refill takes less than 2 seconds.     Coloration: Skin is not jaundiced or pale.     Findings: No rash.  Neurological:     General: No focal deficit present.     Mental Status: She is alert and oriented to person, place, and time.      UC Treatments / Results  Labs (all labs ordered are listed, but only abnormal results are displayed) Labs Reviewed  CULTURE, GROUP A STREP ([place])  NOVEL CORONAVIRUS, NAA  POCT RAPID STREP A (OFFICE)    EKG   Radiology No results found.  Procedures Procedures (including critical care time)  Medications Ordered in UC Medications - No data to display  Initial Impression / Assessment and Plan / UC Course  I have reviewed the triage vital signs and the nursing notes.  Pertinent labs & imaging results that were available during my care of the patient were reviewed by me and considered in my medical decision making (see chart for details).     Patient afebrile, nontoxic, with SpO2 98%.  Covid PCR pending.  Patient to quarantine until  results are back.  We will treat supportively as outlined below.  Return precautions discussed, patient verbalized understanding and  is agreeable to plan. Final Clinical Impressions(s) / UC Diagnoses   Final diagnoses:  Sore throat  URI with cough and congestion     Discharge Instructions     Tessalon for cough. Start flonase, atrovent nasal spray for nasal congestion/drainage. You can use over the counter nasal saline rinse such as neti pot for nasal congestion. Keep hydrated, your urine should be clear to pale yellow in color. Tylenol/motrin for fever and pain. Monitor for any worsening of symptoms, chest pain, shortness of breath, wheezing, swelling of the throat, go to the emergency department for further evaluation needed.     ED Prescriptions    Medication Sig Dispense Auth. Provider   cetirizine (ZYRTEC ALLERGY) 10 MG tablet Take 1 tablet (10 mg total) by mouth daily. 30 tablet Hall-Potvin, [place], PA-C   fluticasone (FLONASE) 50 MCG/ACT nasal spray Place 1 spray into both nostrils daily. 16 g Hall-Potvin, [place], PA-C   benzonatate (TESSALON) 100 MG capsule Take 1 capsule (100 mg total) by mouth every 8 (eight) hours. 21 capsule Hall-Potvin, [place], PA-C     PDMP not reviewed this encounter.   Odette Fraction [place], [place] 06/15/20 [phone]

## 2020-06-15 NOTE — ED Triage Notes (Signed)
Pt is here with a fever, sore throat, body aches that started Thursday, pt started having diarrhea this morning. Pt has taken OTC meds to relieve discomfort.

## 2020-06-15 NOTE — Discharge Instructions (Addendum)

## 2020-06-18 LAB — NOVEL CORONAVIRUS, NAA: SARS-CoV-2, NAA: DETECTED — AB

## 2020-06-19 LAB — CULTURE, GROUP A STREP (THRC)

## 2020-10-18 ENCOUNTER — Other Ambulatory Visit: Payer: Self-pay

## 2020-10-18 ENCOUNTER — Encounter: Payer: Self-pay | Admitting: Emergency Medicine

## 2020-10-18 ENCOUNTER — Ambulatory Visit
Admission: EM | Admit: 2020-10-18 | Discharge: 2020-10-18 | Disposition: A | Discharge status: Home / Self Care | Payer: Self-pay | Attending: Internal Medicine | Admitting: Internal Medicine

## 2020-10-18 DIAGNOSIS — N764 Abscess of vulva: Secondary | ICD-10-CM

## 2020-10-18 MED ORDER — SULFAMETHOXAZOLE-TRIMETHOPRIM 800-160 MG PO TABS: 1.0000 | ORAL_TABLET | Freq: Two times a day (BID) | ORAL | 0 refills | Status: AC

## 2020-10-18 MED ORDER — IBUPROFEN 600 MG PO TABS: 600.0000 mg | ORAL_TABLET | Freq: Four times a day (QID) | ORAL | 0 refills | Status: DC | PRN

## 2020-10-18 NOTE — Discharge Instructions (Signed)
Sitz bath's 2-3 times a day Take antibiotics as prescribed If you notice worsening swelling, fluctuance or drainage please return to urgent care to be reevaluated.

## 2020-10-18 NOTE — ED Triage Notes (Signed)
Pt here with abscess to right upper leg near groin area x 3 days with pain; denies any drainage

## 2020-10-18 NOTE — ED Provider Notes (Signed)
[facility]    CSN: [id number] Arrival date & time: 10/18/20  2595      History   Chief Complaint Chief Complaint  Patient presents with  . Abscess    HPI Alicia Rodriguez is a 30 y.o. female comes to urgent care with painful swelling in the right labia.  Symptoms started a few days ago.  Pain is throbbing, constant, of moderate severity, aggravated by movement and denies any known relieving factors.  No fever or chills.  No discharge from the swelling.  Patient has a history of skin abscesses attributable to folliculitis.   No vaginal discharge.  No dysuria, urgency or frequency.  HPI  Past Medical History:  Diagnosis Date  . No pertinent past medical history     There are no problems to display for this patient.   Past Surgical History:  Procedure Laterality Date  . TONSILLECTOMY      OB History    Gravida  0   Para      Term      Preterm      AB      Living        SAB      IAB      Ectopic      Multiple      Live Births               Home Medications    Prior to Admission medications   Medication Sig Start Date End Date Taking? Authorizing Provider  ibuprofen (ADVIL) 600 MG tablet Take 1 tablet (600 mg total) by mouth every 6 (six) hours as needed. 10/18/20  Yes Kerrie Timm, Britta Mccreedy, MD  sulfamethoxazole-trimethoprim (BACTRIM DS) 800-160 MG tablet Take 1 tablet by mouth 2 (two) times daily for 7 days. 10/18/20 10/25/20 Yes Grant Henkes, Britta Mccreedy, MD  cetirizine (ZYRTEC ALLERGY) 10 MG tablet Take 1 tablet (10 mg total) by mouth daily. 06/15/20   Hall-Potvin, [place], PA-C  fluticasone (FLONASE) 50 MCG/ACT nasal spray Place 1 spray into both nostrils daily. 06/15/20   Hall-Potvin, [place], PA-C  omeprazole (PRILOSEC) 20 MG capsule Take 1 capsule (20 mg total) by mouth daily. 01/03/20   Moshe Cipro, NP    Family History Family History  Problem Relation Age of Onset  . Hyperlipidemia Mother   . Hypertension Mother   . Diabetes Mother    . Other Neg Hx     Social History Social History   Tobacco Use  . Smoking status: Former Smoker    Packs/day: 0.50    Types: Cigarettes  . Smokeless tobacco: Never Used  Substance Use Topics  . Alcohol use: Yes  . Drug use: No     Allergies   Patient has no known allergies.   Review of Systems Review of Systems  HENT: Negative.   Respiratory: Negative.   Gastrointestinal: Negative.   Genitourinary: Positive for vaginal pain. Negative for vaginal bleeding.  Musculoskeletal: Negative.   Skin: Positive for rash. Negative for color change and wound.     Physical Exam Triage Vital Signs ED Triage Vitals  Enc Vitals Group     BP 10/18/20 0835 130/89     Pulse Rate 10/18/20 0835 95     Resp 10/18/20 0835 18     Temp 10/18/20 0835 98.6 F (37 C)     Temp Source 10/18/20 0835 Oral     SpO2 10/18/20 0835 99 %     Weight --      Height --  Head Circumference --      Peak Flow --      Pain Score 10/18/20 0836 8     Pain Loc --      Pain Edu? --      Excl. in [place]? --    No data found.  Updated Vital Signs BP 130/89 (BP Location: Right Arm)   Pulse 95   Temp 98.6 F (37 C) (Oral)   Resp 18   SpO2 99%   Visual Acuity Right Eye Distance:   Left Eye Distance:   Bilateral Distance:    Right Eye Near:   Left Eye Near:    Bilateral Near:     Physical Exam Vitals and nursing note reviewed.  Constitutional:      General: She is not in acute distress.    Appearance: She is not ill-appearing.  Cardiovascular:     Rate and Rhythm: Normal rate and regular rhythm.  Genitourinary:    Comments: Swelling over the right posterior third labia majora.  No vaginal wall is without any swelling or erythema.  No fluctuance noted.  No drainable abscess noted no significant erythema overlying the tender swelling. Musculoskeletal:        General: Normal range of motion.  Neurological:     Mental Status: She is alert.      UC Treatments / Results  Labs (all labs  ordered are listed, but only abnormal results are displayed) Labs Reviewed - No data to display  EKG   Radiology No results found.  Procedures Procedures (including critical care time)  Medications Ordered in UC Medications - No data to display  Initial Impression / Assessment and Plan / UC Course  I have reviewed the triage vital signs and the nursing notes.  Pertinent labs & imaging results that were available during my care of the patient were reviewed by me and considered in my medical decision making (see chart for details).     1.  Abscess of the labia majora: Abscesses with immature abscess and this is likely folliculitis related Bactrim double strength 1 tablet twice daily for 7 days Ibuprofen 600 mg every 6 hours as needed for pain Sits baths advised Return to urgent care if pain worsens or there is fluctuance or if you develop fever/chills. Final Clinical Impressions(s) / UC Diagnoses   Final diagnoses:  Abscess of labia majora     Discharge Instructions     Sitz bath's 2-3 times a day Take antibiotics as prescribed If you notice worsening swelling, fluctuance or drainage please return to urgent care to be reevaluated.   ED Prescriptions    Medication Sig Dispense Auth. Provider   sulfamethoxazole-trimethoprim (BACTRIM DS) 800-160 MG tablet Take 1 tablet by mouth 2 (two) times daily for 7 days. 14 tablet Demaya Hardge, Britta Mccreedy, MD   ibuprofen (ADVIL) 600 MG tablet Take 1 tablet (600 mg total) by mouth every 6 (six) hours as needed. 30 tablet Hurshell Dino, Britta Mccreedy, MD     PDMP not reviewed this encounter.   Merrilee Jansky, MD 10/18/20 [phone]

## 2021-03-02 ENCOUNTER — Emergency Department (HOSPITAL_COMMUNITY)
Admission: EM | Admit: 2021-03-02 | Discharge: 2021-03-02 | Disposition: A | Discharge status: Home / Self Care | Payer: Self-pay | Attending: Emergency Medicine | Admitting: Emergency Medicine

## 2021-03-02 ENCOUNTER — Encounter (HOSPITAL_COMMUNITY): Payer: Self-pay | Admitting: Emergency Medicine

## 2021-03-02 ENCOUNTER — Other Ambulatory Visit: Payer: Self-pay

## 2021-03-02 ENCOUNTER — Encounter: Payer: Self-pay | Admitting: Emergency Medicine

## 2021-03-02 ENCOUNTER — Ambulatory Visit
Admission: EM | Admit: 2021-03-02 | Discharge: 2021-03-02 | Disposition: A | Discharge status: Home / Self Care | Payer: Self-pay | Attending: Urgent Care | Admitting: Urgent Care

## 2021-03-02 DIAGNOSIS — T7840XA Allergy, unspecified, initial encounter: Secondary | ICD-10-CM | POA: Insufficient documentation

## 2021-03-02 DIAGNOSIS — R220 Localized swelling, mass and lump, head: Secondary | ICD-10-CM | POA: Insufficient documentation

## 2021-03-02 DIAGNOSIS — K047 Periapical abscess without sinus: Secondary | ICD-10-CM

## 2021-03-02 DIAGNOSIS — Z87891 Personal history of nicotine dependence: Secondary | ICD-10-CM | POA: Insufficient documentation

## 2021-03-02 DIAGNOSIS — K0889 Other specified disorders of teeth and supporting structures: Secondary | ICD-10-CM

## 2021-03-02 DIAGNOSIS — R519 Headache, unspecified: Secondary | ICD-10-CM

## 2021-03-02 MED ORDER — PREDNISONE 10 MG PO TABS: 20.0000 mg | ORAL_TABLET | Freq: Every day | ORAL | 0 refills | Status: DC

## 2021-03-02 MED ORDER — KETOROLAC TROMETHAMINE 60 MG/2ML IM SOLN
60.0000 mg | Freq: Once | INTRAMUSCULAR | Status: AC
  Administered 2021-03-02: 60 mg via INTRAMUSCULAR

## 2021-03-02 MED ORDER — CLINDAMYCIN HCL 150 MG PO CAPS: 150.0000 mg | ORAL_CAPSULE | Freq: Three times a day (TID) | ORAL | 0 refills | Status: DC

## 2021-03-02 MED ORDER — NAPROXEN 500 MG PO TABS: 500.0000 mg | ORAL_TABLET | Freq: Two times a day (BID) | ORAL | 0 refills | Status: DC

## 2021-03-02 MED ORDER — AMOXICILLIN-POT CLAVULANATE 875-125 MG PO TABS: 1.0000 | ORAL_TABLET | Freq: Two times a day (BID) | ORAL | 0 refills | Status: DC

## 2021-03-02 MED ORDER — HYDROCODONE-ACETAMINOPHEN 5-325 MG PO TABS: 1.0000 | ORAL_TABLET | Freq: Four times a day (QID) | ORAL | 0 refills | Status: DC | PRN

## 2021-03-02 NOTE — Discharge Instructions (Addendum)
Please schedule naproxen twice daily with food for your severe pain.  If you still have pain despite taking naproxen regularly, this is breakthrough pain.  You can use hydrocodone, a narcotic pain medicine, once every 4-6 hours for this.  Once your pain is better controlled, switch back to just naproxen.   [facility] [phone] extension [phone]  Dr. Lawrence Marseilles [phone] [address]  [place] [phone] [address] [facility].  [facility] [phone] extension [phone], [place], [place], [address] First come first serve for the first 10 clients.  May do simple extractions only, no wisdom teeth or surgery.  You may try the second for Thursday of the month starting at 6:30 AM.  [facility] You may call the school to see if they are still helping to provide dental care for emergent cases.

## 2021-03-02 NOTE — ED Triage Notes (Signed)
Patient states pain and swelling for the past few days. Patient with reaction to her abx.

## 2021-03-02 NOTE — Discharge Instructions (Addendum)
Stop taking the Augmentin and naproxen. Start taking clindamycin 3x daily for the next 7 days for the dental infection. Take prednisone 40 mg daily for the next 5 days.  You can also take Claritin, zyrtec or Benadryl as needed for the swelling.  The swelling should start to resolve in the next few days.  If it worsens, if you have difficulty swallowing, if you feel chest pain, shortness of breath, or have any other signs of an allergic reaction please return back to the ED for additional evaluation. Follow-up with the dental practice as planned

## 2021-03-02 NOTE — ED Triage Notes (Signed)
Pt here with right sided dental pain and swelling x 2 days

## 2021-03-02 NOTE — ED Provider Notes (Signed)
[facility]-EMERGENCY DEPT Provider Note   CSN: [id number] Arrival date & time: 03/02/21  1905     History No chief complaint on file.   Alicia Rodriguez is a 30 y.o. female.  HPI  Patient presents with facial swelling.  She recently was started on Augmentin and naproxen for dental infection.  The first dose of both of these medicines this morning and noticed swelling around her face.  She had any difficulty swallowing, no chest pain or shortness of breath.  She came to the ED because she was concerned about the swelling in her face.  No associated pain.  She has not yet been able to see dentist for the infection.  She has not tried any alleviating factors, taking the medicine is an aggravating factor.  Swelling is not constant, not worsening throughout the day.  No blurry vision  Past Medical History:  Diagnosis Date  . No pertinent past medical history     There are no problems to display for this patient.   Past Surgical History:  Procedure Laterality Date  . TONSILLECTOMY       OB History     Gravida  0   Para      Term      Preterm      AB      Living         SAB      IAB      Ectopic      Multiple      Live Births              Family History  Problem Relation Age of Onset  . Hyperlipidemia Mother   . Hypertension Mother   . Diabetes Mother   . Other Neg Hx     Social History   Tobacco Use  . Smoking status: Former    Packs/day: 0.50    Types: Cigarettes  . Smokeless tobacco: Never  Substance Use Topics  . Alcohol use: Yes  . Drug use: No    Home Medications Prior to Admission medications   Medication Sig Start Date End Date Taking? Authorizing Provider  amoxicillin-clavulanate (AUGMENTIN) 875-125 MG tablet Take 1 tablet by mouth 2 (two) times daily. 03/02/21   Wallis Bamberg, PA-C  cetirizine (ZYRTEC ALLERGY) 10 MG tablet Take 1 tablet (10 mg total) by mouth daily. 06/15/20   Hall-Potvin, [place], PA-C   fluticasone (FLONASE) 50 MCG/ACT nasal spray Place 1 spray into both nostrils daily. 06/15/20   Hall-Potvin, [place], PA-C  HYDROcodone-acetaminophen (NORCO/VICODIN) 5-325 MG tablet Take 1 tablet by mouth every 6 (six) hours as needed for severe pain. 03/02/21   Wallis Bamberg, PA-C  ibuprofen (ADVIL) 600 MG tablet Take 1 tablet (600 mg total) by mouth every 6 (six) hours as needed. 10/18/20   Merrilee Jansky, MD  naproxen (NAPROSYN) 500 MG tablet Take 1 tablet (500 mg total) by mouth 2 (two) times daily with a meal. 03/02/21   Wallis Bamberg, PA-C  omeprazole (PRILOSEC) 20 MG capsule Take 1 capsule (20 mg total) by mouth daily. 01/03/20   Moshe Cipro, NP    Allergies    Patient has no known allergies.  Review of Systems   Review of Systems  Constitutional:  Negative for fever.  HENT:  Positive for facial swelling. Negative for sore throat and trouble swallowing.   Eyes:  Negative for visual disturbance.  Respiratory:  Negative for shortness of breath.   Cardiovascular:  Negative for chest pain.  Gastrointestinal:  Negative for abdominal pain, nausea and vomiting.  Skin:  Negative for rash.  Neurological:  Negative for syncope and weakness.   Physical Exam Updated Vital Signs BP (!) 155/103 (BP Location: Left Arm)   Pulse 84   Temp 98.4 F (36.9 C) (Oral)   Resp 16   SpO2 93%   Physical Exam Vitals and nursing note reviewed. Exam conducted with a chaperone present.  Constitutional:      Appearance: Normal appearance.  HENT:     Head: Normocephalic and atraumatic.     Comments: Facial swelling    Mouth/Throat:     Comments: Airway is patent, uvula is midline.  Unable to appreciate any dental abscess. Eyes:     General: No scleral icterus.       Right eye: No discharge.        Left eye: No discharge.     Extraocular Movements: Extraocular movements intact.     Pupils: Pupils are equal, round, and reactive to light.  Cardiovascular:     Rate and Rhythm: Normal rate and  regular rhythm.     Pulses: Normal pulses.     Heart sounds: Normal heart sounds. No murmur heard.   No friction rub. No gallop.  Pulmonary:     Effort: Pulmonary effort is normal. No respiratory distress.     Breath sounds: Normal breath sounds.     Comments: Speaking in complete sentences, no respiratory distress. Abdominal:     General: Abdomen is flat. Bowel sounds are normal. There is no distension.     Palpations: Abdomen is soft.     Tenderness: There is no abdominal tenderness.  Skin:    General: Skin is warm and dry.     Coloration: Skin is not jaundiced.     Comments: No hives or rashes appreciated.  Neurological:     Mental Status: She is alert. Mental status is at baseline.     Coordination: Coordination normal.   ED Results / Procedures / Treatments   Labs (all labs ordered are listed, but only abnormal results are displayed) Labs Reviewed - No data to display  EKG None  Radiology No results found.  Procedures Procedures   Medications Ordered in ED Medications - No data to display  ED Course  I have reviewed the triage vital signs and the nursing notes.  Pertinent labs & imaging results that were available during my care of the patient were reviewed by me and considered in my medical decision making (see chart for details).    MDM Rules/Calculators/A&P                           Patient vitals are stable, no tachycardia or hypotension.  Airway is clear, no chest pain or shortness of breath.  This is not anaphylaxis.  No rashes.  She does have some facial swelling, but is not obstructing her vision.  Given the history and timeline I think she is most likely allergic to Augmentin.  We will switch her to a different antibiotic and started on a prednisone taper.  Strict return precautions were discussed at length with the patient.  Plan to have her follow-up with a dentist as planned, she will return if any signs of anaphylaxis present.  Final Clinical  Impression(s) / ED Diagnoses Final diagnoses:  None    Rx / DC Orders ED Discharge Orders     None  Theron Arista, PA-C 03/02/21 Nena Polio, MD 03/03/21 [phone]

## 2021-03-02 NOTE — ED Provider Notes (Signed)
[facility]   MRN: [id number] DOB: [birth year]  Subjective:   Alicia Rodriguez is a 30 y.o. female presenting for 2-day history of acute onset worsening severe dental pain over the right side with facial swelling and pain.  Patient plans on setting up a dental consultation soon.  Has significant difficulty chewing, swallowing.  Denies fever, chest pain, shortness of breath, nausea, vomiting, abdominal pain.  No current facility-administered medications for this encounter.  Current Outpatient Medications:    cetirizine (ZYRTEC ALLERGY) 10 MG tablet, Take 1 tablet (10 mg total) by mouth daily., Disp: 30 tablet, Rfl: 0   fluticasone (FLONASE) 50 MCG/ACT nasal spray, Place 1 spray into both nostrils daily., Disp: 16 g, Rfl: 0   ibuprofen (ADVIL) 600 MG tablet, Take 1 tablet (600 mg total) by mouth every 6 (six) hours as needed., Disp: 30 tablet, Rfl: 0   omeprazole (PRILOSEC) 20 MG capsule, Take 1 capsule (20 mg total) by mouth daily., Disp: 30 capsule, Rfl: 0   No Known Allergies  Past Medical History:  Diagnosis Date   No pertinent past medical history      Past Surgical History:  Procedure Laterality Date   TONSILLECTOMY      Family History  Problem Relation Age of Onset   Hyperlipidemia Mother    Hypertension Mother    Diabetes Mother    Other Neg Hx     Social History   Tobacco Use   Smoking status: Former    Packs/day: 0.50    Types: Cigarettes   Smokeless tobacco: Never  Substance Use Topics   Alcohol use: Yes   Drug use: No    ROS   Objective:   Vitals: BP (!) 158/104 (BP Location: Left Arm)   Pulse 74   Temp 98.3 F (36.8 C) (Oral)   Resp 18   SpO2 97%   Physical Exam Constitutional:      General: She is not in acute distress.    Appearance: Normal appearance. She is well-developed. She is not ill-appearing.  HENT:     Head: Normocephalic and atraumatic.      Nose: Nose normal.     Mouth/Throat:     Mouth: Mucous membranes are  moist.     Dentition: Dental tenderness, gingival swelling and dental caries present. No dental abscesses.     Pharynx: Oropharynx is clear.      Comments: Multiple molars with dental defects, evidence of caries.  Right side is worse over areas outlined with associated gingival swelling and erythema.  There is exquisite tenderness to the area as well. Eyes:     General: No scleral icterus.    Extraocular Movements: Extraocular movements intact.     Pupils: Pupils are equal, round, and reactive to light.  Cardiovascular:     Rate and Rhythm: Normal rate.  Pulmonary:     Effort: Pulmonary effort is normal.  Skin:    General: Skin is warm and dry.  Neurological:     General: No focal deficit present.     Mental Status: She is alert and oriented to person, place, and time.  Psychiatric:        Mood and Affect: Mood normal.        Behavior: Behavior normal.    Assessment and Plan :   I have reviewed the PDMP during this encounter.  1. Dental infection   2. Facial pain   3. Facial swelling   4. Pain, dental     Start Augmentin  for dental infection/abscess, use naproxen for pain and inflammation.  Use hydrocodone for breakthrough pain.  Emphasized need for dental surgeon consult. Counseled patient on potential for adverse effects with medications prescribed/recommended today, strict ER and return-to-clinic precautions discussed, patient verbalized understanding.    Wallis Bamberg, [place] 03/02/21 2202

## 2021-03-18 ENCOUNTER — Other Ambulatory Visit: Payer: Self-pay

## 2021-03-18 ENCOUNTER — Emergency Department (HOSPITAL_BASED_OUTPATIENT_CLINIC_OR_DEPARTMENT_OTHER)
Admission: EM | Admit: 2021-03-18 | Discharge: 2021-03-18 | Disposition: A | Discharge status: Home / Self Care | Payer: 59 | Attending: Emergency Medicine | Admitting: Emergency Medicine

## 2021-03-18 DIAGNOSIS — Z87891 Personal history of nicotine dependence: Secondary | ICD-10-CM | POA: Diagnosis not present

## 2021-03-18 DIAGNOSIS — K047 Periapical abscess without sinus: Secondary | ICD-10-CM | POA: Diagnosis not present

## 2021-03-18 DIAGNOSIS — K0889 Other specified disorders of teeth and supporting structures: Secondary | ICD-10-CM | POA: Diagnosis present

## 2021-03-18 LAB — CBC WITH DIFFERENTIAL/PLATELET
Abs Immature Granulocytes: 0.05 10*3/uL (ref 0.00–0.07)
Basophils Absolute: 0.1 10*3/uL (ref 0.0–0.1)
Basophils Relative: 0 %
Eosinophils Absolute: 0.1 10*3/uL (ref 0.0–0.5)
Eosinophils Relative: 0 %
HCT: 41 % (ref 36.0–46.0)
Hemoglobin: 15.3 g/dL — ABNORMAL HIGH (ref 12.0–15.0)
Immature Granulocytes: 0 %
Lymphocytes Relative: 21 %
Lymphs Abs: 3.3 10*3/uL (ref 0.7–4.0)
MCH: 30.8 pg (ref 26.0–34.0)
MCHC: 37.3 g/dL — ABNORMAL HIGH (ref 30.0–36.0)
MCV: 82.5 fL (ref 80.0–100.0)
Monocytes Absolute: 1.2 10*3/uL — ABNORMAL HIGH (ref 0.1–1.0)
Monocytes Relative: 8 %
Neutro Abs: 11.1 10*3/uL — ABNORMAL HIGH (ref 1.7–7.7)
Neutrophils Relative %: 71 %
Platelets: 387 10*3/uL (ref 150–400)
RBC: 4.97 MIL/uL (ref 3.87–5.11)
RDW: 13.9 % (ref 11.5–15.5)
WBC: 15.8 10*3/uL — ABNORMAL HIGH (ref 4.0–10.5)
nRBC: 0 % (ref 0.0–0.2)

## 2021-03-18 LAB — BASIC METABOLIC PANEL
Anion gap: 8 (ref 5–15)
BUN: 8 mg/dL (ref 6–20)
CO2: 23 mmol/L (ref 22–32)
Calcium: 9.1 mg/dL (ref 8.9–10.3)
Chloride: 105 mmol/L (ref 98–111)
Creatinine, Ser: 0.7 mg/dL (ref 0.44–1.00)
GFR, Estimated: >60 mL/min (ref >60)
Glucose, Bld: 117 mg/dL — ABNORMAL HIGH (ref 70–99)
Potassium: 3.4 mmol/L — ABNORMAL LOW (ref 3.5–5.1)
Sodium: 136 mmol/L (ref 135–145)

## 2021-03-18 MED ORDER — CLINDAMYCIN PHOSPHATE 600 MG/50ML IV SOLN
600.0000 mg | Freq: Once | INTRAVENOUS | Status: AC
  Administered 2021-03-18: 600 mg via INTRAVENOUS
  Filled 2021-03-18: qty 50

## 2021-03-18 MED ORDER — CLINDAMYCIN HCL 300 MG PO CAPS: 300.0000 mg | ORAL_CAPSULE | Freq: Four times a day (QID) | ORAL | 0 refills | Status: DC

## 2021-03-18 MED ORDER — ACETAMINOPHEN 325 MG PO TABS
650.0000 mg | ORAL_TABLET | Freq: Once | ORAL | Status: AC | PRN
  Administered 2021-03-18: 650 mg via ORAL
  Filled 2021-03-18: qty 2

## 2021-03-18 MED ORDER — DEXAMETHASONE SODIUM PHOSPHATE 10 MG/ML IJ SOLN
10.0000 mg | Freq: Once | INTRAMUSCULAR | Status: AC
  Administered 2021-03-18: 10 mg via INTRAVENOUS
  Filled 2021-03-18: qty 1

## 2021-03-18 NOTE — ED Triage Notes (Signed)
Pt c/o left lower dental pain since last night. States put orajel on it last night. Woke up with slight swelling to to cheek. Swelling worsened. Pain improved. Fever now.

## 2021-03-18 NOTE — Discharge Instructions (Signed)
Follow-up with your dentist next week as scheduled.  Return to emergency room if you have any worsening symptoms including worsening facial swelling, vomiting, difficulty swallowing or other worsening symptoms.

## 2021-03-18 NOTE — ED Provider Notes (Signed)
[facility] EMERGENCY DEPARTMENT Provider Note   CSN: [id number] Arrival date & time: 03/18/21  1608     History Chief Complaint  Patient presents with   Facial Swelling    Alicia Rodriguez is a 30 y.o. female.  Patient is a 30 year old female who presents with pain in her left lower tooth.  She says it was hurting yesterday but felt better after she put some Orajel on it.  She woke up this morning with some slight swelling to her right lower jaw.  Its gotten progressively worse throughout the day.  She has not had any known fevers at home but was noted to have a temperature of 100.5 in the ED.  She has had no nausea or vomiting.  She otherwise feels well.  She got an appointment with a dentist on October 12.      Past Medical History:  Diagnosis Date   No pertinent past medical history     There are no problems to display for this patient.   Past Surgical History:  Procedure Laterality Date   TONSILLECTOMY       OB History     Gravida  0   Para      Term      Preterm      AB      Living         SAB      IAB      Ectopic      Multiple      Live Births              Family History  Problem Relation Age of Onset   Hyperlipidemia Mother    Hypertension Mother    Diabetes Mother    Other Neg Hx     Social History   Tobacco Use   Smoking status: Former    Packs/day: 0.50    Types: Cigarettes   Smokeless tobacco: Never  Substance Use Topics   Alcohol use: Yes   Drug use: No    Home Medications Prior to Admission medications   Medication Sig Start Date End Date Taking? Authorizing Provider  clindamycin (CLEOCIN) 300 MG capsule Take 1 capsule (300 mg total) by mouth 4 (four) times daily. X 7 days 03/18/21  Yes Rolan Bucco, MD  cetirizine (ZYRTEC ALLERGY) 10 MG tablet Take 1 tablet (10 mg total) by mouth daily. 06/15/20   Hall-Potvin, [place], PA-C  fluticasone (FLONASE) 50 MCG/ACT nasal spray Place 1 spray into both nostrils  daily. 06/15/20   Hall-Potvin, [place], PA-C  HYDROcodone-acetaminophen (NORCO/VICODIN) 5-325 MG tablet Take 1 tablet by mouth every 6 (six) hours as needed for severe pain. 03/02/21   Wallis Bamberg, PA-C  ibuprofen (ADVIL) 600 MG tablet Take 1 tablet (600 mg total) by mouth every 6 (six) hours as needed. 10/18/20   Merrilee Jansky, MD  naproxen (NAPROSYN) 500 MG tablet Take 1 tablet (500 mg total) by mouth 2 (two) times daily with a meal. 03/02/21   Wallis Bamberg, PA-C  omeprazole (PRILOSEC) 20 MG capsule Take 1 capsule (20 mg total) by mouth daily. 01/03/20   Moshe Cipro, NP  predniSONE (DELTASONE) 10 MG tablet Take 2 tablets (20 mg total) by mouth daily. 03/02/21   Theron Arista, PA-C    Allergies    Penicillins  Review of Systems   Review of Systems  Constitutional:  Positive for fever. Negative for chills, diaphoresis and fatigue.  HENT:  Positive for dental problem and facial swelling.  Negative for congestion, rhinorrhea and sneezing.   Eyes: Negative.   Respiratory:  Negative for cough, chest tightness and shortness of breath.   Cardiovascular:  Negative for chest pain and leg swelling.  Gastrointestinal:  Negative for abdominal pain, blood in stool, diarrhea, nausea and vomiting.  Genitourinary:  Negative for difficulty urinating, flank pain, frequency and hematuria.  Musculoskeletal:  Negative for arthralgias and back pain.  Skin:  Negative for rash.  Neurological:  Negative for dizziness, speech difficulty, weakness, numbness and headaches.   Physical Exam Updated Vital Signs BP (!) 154/101 (BP Location: Right Arm)   Pulse 86   Temp 99.1 F (37.3 C) (Oral)   Resp 16   Ht 5\' 2"  (1.575 m)   Wt 68 kg   SpO2 100%   BMI 27.44 kg/m   Physical Exam Constitutional:      Appearance: She is well-developed.  HENT:     Head: Normocephalic and atraumatic.     Mouth/Throat:     Comments: Positive decayed tooth involving the left lower second molar.  There is some surrounding  swelling to the outer aspect of the jaw adjacent to the tooth.  There is no fluctuance.  It is indurated.  There are some moderate swelling to the face in that area.  There is no elevation of the tongue.  No swelling in the sublingual space.  Uvula is midline. Eyes:     Pupils: Pupils are equal, round, and reactive to light.  Cardiovascular:     Rate and Rhythm: Normal rate and regular rhythm.     Heart sounds: Normal heart sounds.  Pulmonary:     Effort: Pulmonary effort is normal. No respiratory distress.     Breath sounds: Normal breath sounds. No wheezing or rales.  Chest:     Chest wall: No tenderness.  Abdominal:     General: Bowel sounds are normal.     Palpations: Abdomen is soft.     Tenderness: There is no abdominal tenderness. There is no guarding or rebound.  Musculoskeletal:        General: Normal range of motion.     Cervical back: Normal range of motion and neck supple.  Lymphadenopathy:     Cervical: No cervical adenopathy.  Skin:    General: Skin is warm and dry.     Findings: No rash.  Neurological:     Mental Status: She is alert and oriented to person, place, and time.    ED Results / Procedures / Treatments   Labs (all labs ordered are listed, but only abnormal results are displayed) Labs Reviewed  CBC WITH DIFFERENTIAL/PLATELET - Abnormal; Notable for the following components:      Result Value   WBC 15.8 (*)    Hemoglobin 15.3 (*)    MCHC 37.3 (*)    Neutro Abs 11.1 (*)    Monocytes Absolute 1.2 (*)    All other components within normal limits  BASIC METABOLIC PANEL - Abnormal; Notable for the following components:   Potassium 3.4 (*)    Glucose, Bld 117 (*)    All other components within normal limits    EKG None  Radiology No results found.  Procedures Procedures   Medications Ordered in ED Medications  acetaminophen (TYLENOL) tablet 650 mg (650 mg Oral Given 03/18/21 1623)  clindamycin (CLEOCIN) IVPB 600 mg (0 mg Intravenous Stopped  03/18/21 2142)  dexamethasone (DECADRON) injection 10 mg (10 mg Intravenous Given 03/18/21 2103)    ED Course  I  have reviewed the triage vital signs and the nursing notes.  Pertinent labs & imaging results that were available during my care of the patient were reviewed by me and considered in my medical decision making (see chart for details).    MDM Rules/Calculators/A&P                           Patient presents with facial swelling.  Her symptoms are consistent with a dental abscess.  She has no difficulty swallowing or suggestions of a deeper tissue infection.  She was given dose of IV clindamycin and Decadron.  She has an appointment to follow-up with a dentist next week.  She was started on p.o. clindamycin.  Strict return precautions were given. Final Clinical Impression(s) / ED Diagnoses Final diagnoses:  Dental abscess    Rx / DC Orders ED Discharge Orders          Ordered    clindamycin (CLEOCIN) 300 MG capsule  4 times daily        03/18/21 2207             Rolan Bucco, MD 03/18/21 2209

## 2021-03-26 ENCOUNTER — Encounter: Payer: 59 | Admitting: Internal Medicine

## 2021-04-14 ENCOUNTER — Encounter: Payer: 59 | Admitting: Internal Medicine

## 2021-05-01 ENCOUNTER — Encounter: Payer: Self-pay | Admitting: Internal Medicine

## 2021-05-01 ENCOUNTER — Ambulatory Visit (INDEPENDENT_AMBULATORY_CARE_PROVIDER_SITE_OTHER): Payer: 59 | Admitting: Internal Medicine

## 2021-05-01 ENCOUNTER — Other Ambulatory Visit: Payer: Self-pay

## 2021-05-01 VITALS — BP 121/76 | HR 93 | Temp 98.3°F | Ht 62.0 in | Wt 147.7 lb

## 2021-05-01 DIAGNOSIS — L2089 Other atopic dermatitis: Secondary | ICD-10-CM

## 2021-05-01 DIAGNOSIS — Z30430 Encounter for insertion of intrauterine contraceptive device: Secondary | ICD-10-CM | POA: Diagnosis not present

## 2021-05-01 DIAGNOSIS — Z0000 Encounter for general adult medical examination without abnormal findings: Secondary | ICD-10-CM | POA: Diagnosis not present

## 2021-05-01 DIAGNOSIS — Z30011 Encounter for initial prescription of contraceptive pills: Secondary | ICD-10-CM

## 2021-05-01 DIAGNOSIS — L209 Atopic dermatitis, unspecified: Secondary | ICD-10-CM | POA: Insufficient documentation

## 2021-05-01 MED ORDER — HYDROCORTISONE VALERATE 0.2 % EX CREA: 1.0000 "application " | TOPICAL_CREAM | Freq: Two times a day (BID) | CUTANEOUS | 0 refills | Status: DC

## 2021-05-01 MED ORDER — CICLOPIROX 1 % EX SHAM: 1.0000 "application " | MEDICATED_SHAMPOO | Freq: Every day | CUTANEOUS | 0 refills | Status: DC

## 2021-05-01 NOTE — Progress Notes (Signed)
CC: new to establish care at clinic   HPI:  [name] is a 30 y.o. female with a PMHx stated below and presents today for stated above. Please see the Encounters tab for problem-based Assessment & Plan for additional details.   Past Medical History:  Diagnosis Date   No pertinent past medical history     Current Outpatient Medications on File Prior to Visit  Medication Sig Dispense Refill   cetirizine (ZYRTEC ALLERGY) 10 MG tablet Take 1 tablet (10 mg total) by mouth daily. 30 tablet 0   clindamycin (CLEOCIN) 300 MG capsule Take 1 capsule (300 mg total) by mouth 4 (four) times daily. X 7 days 28 capsule 0   fluticasone (FLONASE) 50 MCG/ACT nasal spray Place 1 spray into both nostrils daily. 16 g 0   HYDROcodone-acetaminophen (NORCO/VICODIN) 5-325 MG tablet Take 1 tablet by mouth every 6 (six) hours as needed for severe pain. 10 tablet 0   ibuprofen (ADVIL) 600 MG tablet Take 1 tablet (600 mg total) by mouth every 6 (six) hours as needed. 30 tablet 0   naproxen (NAPROSYN) 500 MG tablet Take 1 tablet (500 mg total) by mouth 2 (two) times daily with a meal. 30 tablet 0   omeprazole (PRILOSEC) 20 MG capsule Take 1 capsule (20 mg total) by mouth daily. 30 capsule 0   predniSONE (DELTASONE) 10 MG tablet Take 2 tablets (20 mg total) by mouth daily. 20 tablet 0   No current facility-administered medications on file prior to visit.    Family History  Problem Relation Age of Onset   Hyperlipidemia Mother    Hypertension Mother    Diabetes Mother    Other Neg Hx     Social History   Socioeconomic History   Marital status: Single    Spouse name: Not on file   Number of children: Not on file   Years of education: Not on file   Highest education level: Not on file  Occupational History   Not on file  Tobacco Use   Smoking status: Former    Packs/day: 0.50    Types: Cigarettes   Smokeless tobacco: Never  Substance and Sexual Activity   Alcohol use: Yes   Drug use: No    Sexual activity: Yes    Birth control/protection: None  Other Topics Concern   Not on file  Social History Narrative   Not on file   Social Determinants of Health   Financial Resource Strain: Not on file  Food Insecurity: Not on file  Transportation Needs: Not on file  Physical Activity: Not on file  Stress: Not on file  Social Connections: Not on file  Intimate Partner Violence: Not on file    Review of Systems: ROS negative except for what is noted on the assessment and plan.  Vitals:   05/01/21 1333  BP: 121/76  Pulse: 93  Temp: 98.3 F (36.8 C)  TempSrc: Oral  SpO2: 100%  Weight: 147 lb 11.2 oz (67 kg)  Height: 5\' 2"  (1.575 m)     Physical Exam: Constitutional: alert, well-appearing, in NAD HENT: normocephalic, atraumatic, mucous membranes moist Eyes: conjunctiva non-erythematous, EOMI Cardiovascular: RRR, no m/r/g, non-edematous bilateral LE Pulmonary/Chest: normal work of breathing on RA, LCTAB Abdominal: soft, non-tender to palpation, non-distended MSK: normal bulk and tone  Neurological: A&O x 3, 5/5 strength in bilateral upper and lower extremities  Skin: warm and dry. Moderate-severe atopic dermatitis present in flexural regions including arms, abdomen, and neck, extending up  to scalp and behind ears. Irritation from intense scratching resulting in leathery appearing skin in affected regions. Has a fissure in right 5th digit toe. Psych: normal behavior, normal affect    Assessment & Plan:   See Encounters Tab for problem based charting.  Patient seen with Dr. Hughie Closs, MD  Internal Medicine Resident, PGY-1 [facility]

## 2021-05-01 NOTE — Assessment & Plan Note (Addendum)
Pt has hx of severe eczema as a child, and family hx of it in mom. Moderate-severe atopic dermatitis present in flexural regions including arms, abdomen, and neck, extending up to scalp and behind ears. Reports associated hair loss. Irritation from intense scratching resulting in leathery appearing skin in affected regions. Has a fissure in right 5th digit toe, resulting in occasional pain and pruritis. States that it is a result of interdigit eczema. Could be 2/2 eczema or fungus or both. Will see how it reacts to topical steroids and then reassess.    Hydrocortisone valerate cream 0.2% Ciclopirox shampoo for scalp  Addendum: Pt reports hydrocortison valerate cream was too pricey even with insurance (~ $60), sent in prescription for a different medium strength steroid cream (Elocon). Explained to pt that unfortunately these strength of steroid creams tend to have a considerable cost associated with it, even with insurance. Pt reports understanding.

## 2021-05-01 NOTE — Patient Instructions (Addendum)
Thank you, Ms.Gowri Danella Penton for allowing [place] to provide your care today!  Today we discussed:  Eczema- I have prescribed a steroid cream for you. You may use this on those sites up to 2 times day, including in between your toes. Please avoid scratching these areas.   Birth control- I have referred you to Gynecology for talking about an IUD placement.   I will call you if any labs, tests, or imaging results require any further attention or action.   Referrals ordered today:   Referral Orders         Ambulatory referral to Gynecology       Medications ordered:  Start the following medications: Meds ordered this encounter  Medications   hydrocortisone valerate cream (WESTCORT) 0.2 %    Sig: Apply 1 application topically 2 (two) times daily.    Dispense:  60 g    Refill:  0    Follow up in: 3 mo    Should you have any questions or concerns please call the internal medicine clinic at [phone].     Carmel Sacramento, MD  Internal Medicine Resident, PGY-1 [facility]

## 2021-05-01 NOTE — Assessment & Plan Note (Addendum)
HIV screening offered, pt declined Hep C screening offered, pt declined Flu shot offered, pt declined Pap smear deferred to next visit by pt   Pt is sexually active with 1 long-standing female partner. Current contraceptive is condoms. Extensive counseling done on types of BC including combined hormonal pills, nexplanon, and IUDs. Not ideal candidate for oral given active smoking hx and age of 56. Patient interested in IUD; gynecology referral placed. Educated to continue using condoms for STI prevention, as IUD will not prevent STI's.

## 2021-05-02 NOTE — Addendum Note (Signed)
Addended byCarmel Sacramento on: 05/02/2021 11:56 AM   Modules accepted: Orders

## 2021-05-04 ENCOUNTER — Other Ambulatory Visit: Payer: Self-pay | Admitting: Internal Medicine

## 2021-05-04 DIAGNOSIS — L2089 Other atopic dermatitis: Secondary | ICD-10-CM

## 2021-05-06 NOTE — Progress Notes (Signed)
Internal Medicine Clinic Attending  I saw and evaluated the patient.  I personally confirmed the key portions of the history and exam documented by Dr. Patel and I reviewed pertinent patient test results.  The assessment, diagnosis, and plan were formulated together and I agree with the documentation in the resident's note.  

## 2021-05-07 ENCOUNTER — Other Ambulatory Visit: Payer: Self-pay | Admitting: Internal Medicine

## 2021-05-07 ENCOUNTER — Telehealth: Payer: Self-pay

## 2021-05-07 DIAGNOSIS — L2089 Other atopic dermatitis: Secondary | ICD-10-CM

## 2021-05-07 NOTE — Telephone Encounter (Signed)
Pt's states she cannot afford the eczema cream. Please call pt back.

## 2021-05-07 NOTE — Telephone Encounter (Signed)
Pt called back. Did not answer. VM left.

## 2021-05-14 ENCOUNTER — Telehealth: Payer: Self-pay

## 2021-05-14 MED ORDER — MOMETASONE FUROATE 0.1 % EX CREA: 1.0000 "application " | TOPICAL_CREAM | Freq: Every day | CUTANEOUS | 0 refills | Status: DC

## 2021-05-14 NOTE — Telephone Encounter (Signed)
Please call pt back @ [phone].

## 2021-05-14 NOTE — Telephone Encounter (Signed)
PCP tried calling patient several days ago and LM.   A message was forwarded to PCP today informing him that patient was returning his call. This encounter closed SChaplin, RN,BSN

## 2021-05-14 NOTE — Addendum Note (Signed)
Addended byCarmel Sacramento on: 05/14/2021 10:41 AM   Modules accepted: Orders

## 2021-05-14 NOTE — Telephone Encounter (Signed)
Patient returning call to PCP.

## 2021-07-02 ENCOUNTER — Encounter: Payer: 59 | Admitting: Nurse Practitioner

## 2021-07-17 ENCOUNTER — Encounter: Payer: 59 | Admitting: Obstetrics and Gynecology

## 2021-10-17 IMAGING — DX DG CHEST 1V PORT
1 series · 1 of 1 positions shown · non-contrast
Comparison: 03/03/2018

CLINICAL DATA: Cough and chest congestion for 2 weeks.

EXAM:
PORTABLE CHEST 1 VIEW

[chest ap]
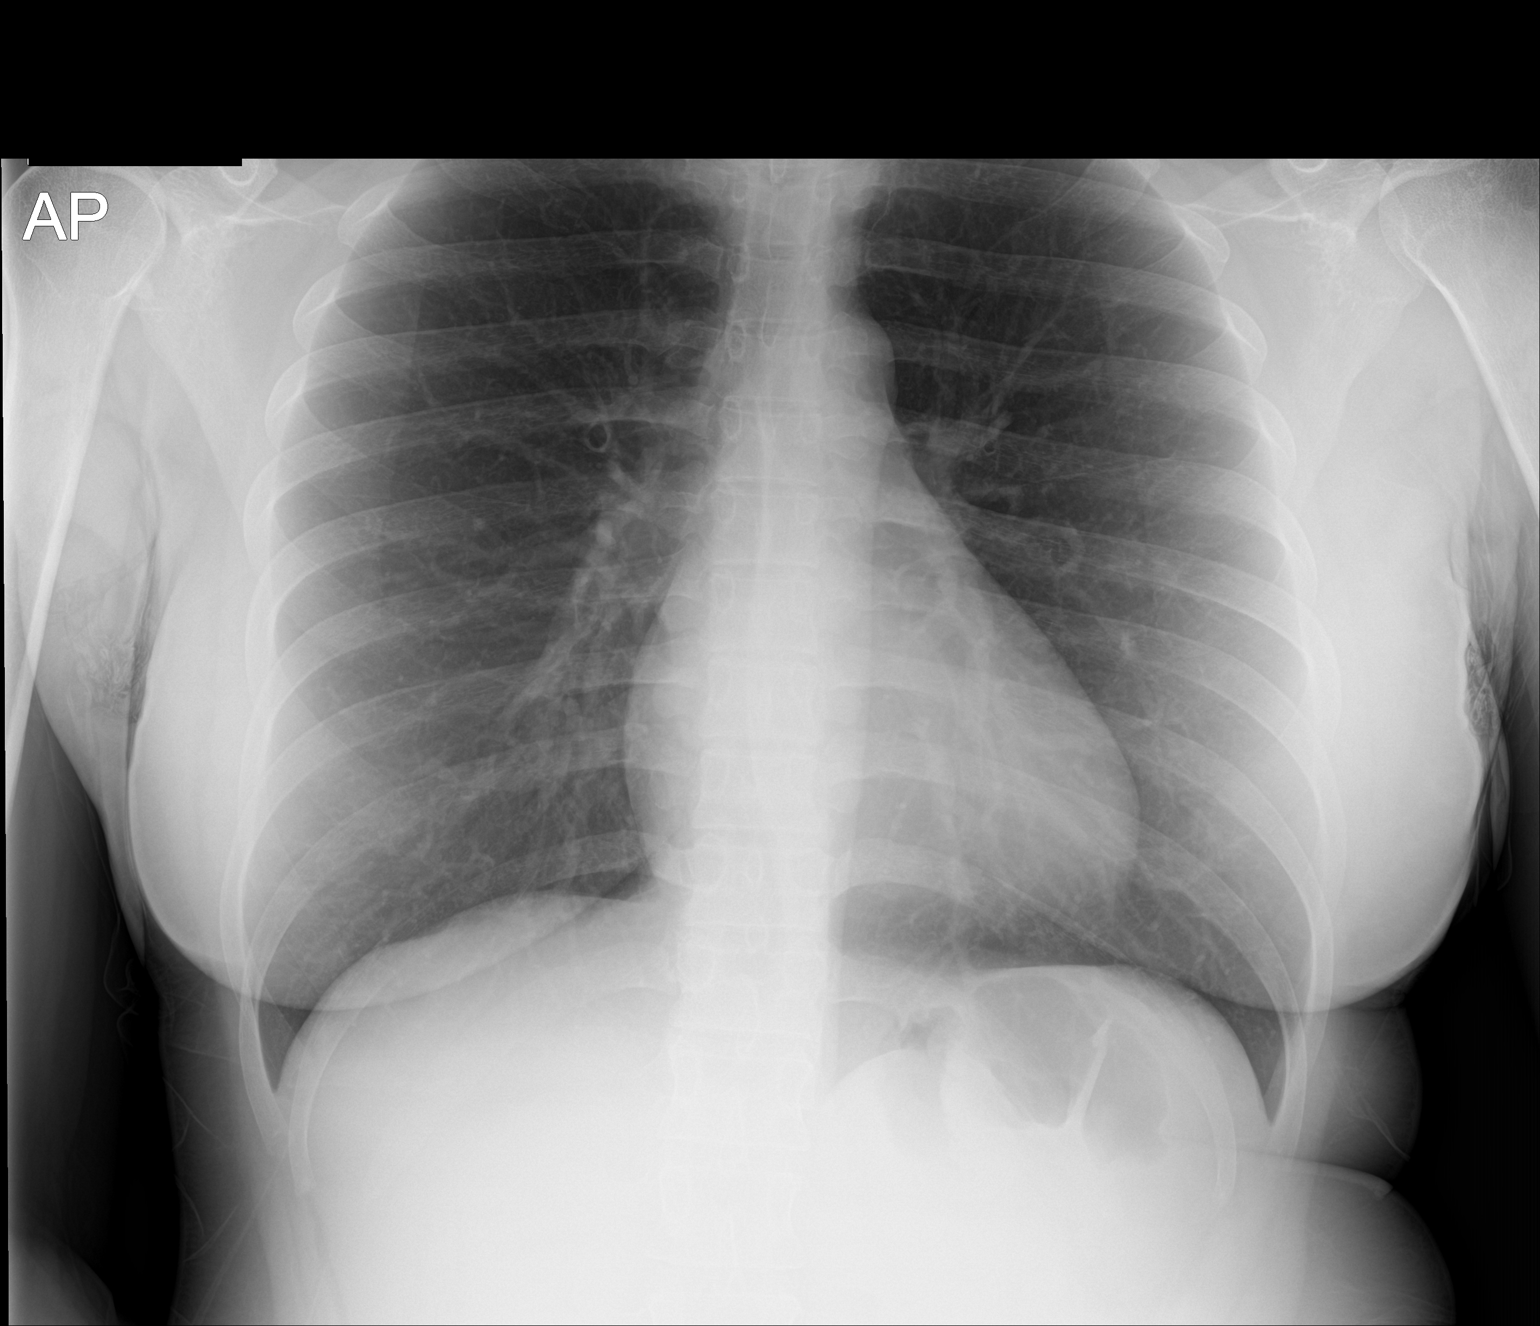

[1 of 1 positions shown; findings below may reference images not displayed]

FINDINGS: The heart size and mediastinal contours are within normal limits.
Both lungs are clear. The visualized skeletal structures are
unremarkable.
IMPRESSION: Negative.  No active disease.

## 2021-12-07 ENCOUNTER — Encounter: Payer: Self-pay | Admitting: *Deleted

## 2022-08-17 ENCOUNTER — Ambulatory Visit (HOSPITAL_COMMUNITY)
Admission: RE | Admit: 2022-08-17 | Discharge: 2022-08-17 | Disposition: A | Discharge status: Home / Self Care | Payer: Medicaid Other | Source: Ambulatory Visit | Attending: Internal Medicine | Admitting: Internal Medicine

## 2022-08-17 ENCOUNTER — Other Ambulatory Visit: Payer: Self-pay

## 2022-08-17 ENCOUNTER — Ambulatory Visit: Payer: Medicaid Other

## 2022-08-17 VITALS — BP 141/79 | HR 92 | Temp 98.0°F | Ht 62.0 in | Wt 196.5 lb

## 2022-08-17 DIAGNOSIS — R000 Tachycardia, unspecified: Secondary | ICD-10-CM | POA: Insufficient documentation

## 2022-08-17 DIAGNOSIS — L2089 Other atopic dermatitis: Secondary | ICD-10-CM

## 2022-08-17 DIAGNOSIS — R61 Generalized hyperhidrosis: Secondary | ICD-10-CM

## 2022-08-17 DIAGNOSIS — Z114 Encounter for screening for human immunodeficiency virus [HIV]: Secondary | ICD-10-CM

## 2022-08-17 DIAGNOSIS — I517 Cardiomegaly: Secondary | ICD-10-CM

## 2022-08-17 DIAGNOSIS — Z1159 Encounter for screening for other viral diseases: Secondary | ICD-10-CM | POA: Diagnosis not present

## 2022-08-17 DIAGNOSIS — I10 Essential (primary) hypertension: Secondary | ICD-10-CM

## 2022-08-17 DIAGNOSIS — Z0000 Encounter for general adult medical examination without abnormal findings: Secondary | ICD-10-CM

## 2022-08-17 DIAGNOSIS — L219 Seborrheic dermatitis, unspecified: Secondary | ICD-10-CM | POA: Diagnosis not present

## 2022-08-17 MED ORDER — CICLOPIROX 1 % EX SHAM: 1.0000 "application " | MEDICATED_SHAMPOO | Freq: Every day | CUTANEOUS | 0 refills | Status: DC

## 2022-08-17 MED ORDER — MOMETASONE FUROATE 0.1 % EX CREA: 1.0000 | TOPICAL_CREAM | Freq: Every day | CUTANEOUS | 3 refills | Status: DC

## 2022-08-17 MED ORDER — HYDROCHLOROTHIAZIDE 12.5 MG PO CAPS: 12.5000 mg | ORAL_CAPSULE | Freq: Every day | ORAL | 1 refills | Status: DC

## 2022-08-17 NOTE — Progress Notes (Unsigned)
HCM HIV,HCV Pap  Atopic dermatitis Addendum: Pt reports hydrocortison valerate cream was too pricey even with insurance (~ $60), sent in prescription for a different medium strength steroid cream (Elocon). Explained to pt that unfortunately these strength of steroid creams tend to have a considerable cost associated with it, even with insurance. Pt reports understanding.

## 2022-08-17 NOTE — Patient Instructions (Addendum)
Thank you, [name] for allowing [place] to provide your care today. Today we discussed :  High blood pressure: We will start a medicine called HCTZ today and will check your kidney function and electrolytes.  Fast heart rate: We will get an EKG to see why your heart rate is fast. We will also check your thyroid function.  Head itching: Continue using ketoconazole shampoo. You can also try head and shoulders or keralyte which are over the counter shampoos.  Pap smear: We will schedule you to come back with a female provider for cervical cancer screening (pap smear). You will only need this once every 5 years.  I have ordered the following labs for you:   Lab Orders         BMP8+Anion Gap         TSH        I have ordered the following medication/changed the following medications:   Stop the following medications: There are no discontinued medications.   Start the following medications: Meds ordered this encounter  Medications   hydrochlorothiazide (MICROZIDE) 12.5 MG capsule    Sig: Take 1 capsule (12.5 mg total) by mouth daily.    Dispense:  30 capsule    Refill:  1     Follow up: Within 1 month for pap smear    We look forward to seeing you next time. Please call our clinic at [phone] if you have any questions or concerns. The best time to call is Monday-Friday from 9am-4pm, but there is someone available 24/7. If after hours or the weekend, call the main hospital number and ask for the Internal Medicine Resident On-Call. If you need medication refills, please notify your pharmacy one week in advance and they will send [place] a request.   Thank you for trusting me with your care. Wishing you the best!   Iona Coach, MD [place]

## 2022-08-18 DIAGNOSIS — I10 Essential (primary) hypertension: Secondary | ICD-10-CM | POA: Insufficient documentation

## 2022-08-18 DIAGNOSIS — R000 Tachycardia, unspecified: Secondary | ICD-10-CM | POA: Insufficient documentation

## 2022-08-18 NOTE — Assessment & Plan Note (Signed)
BP elevated to 141/79 in clinic. Patient presents after being told by dentist that BP was high and needed to be controlled prior to wisdom teeth removal and root canal. On review has had elevated BP in prior visits. Patient endorses weight gain over past year related to her diet. On chart review 146 lbs 04/2021 to 196lbs 08/2022, do suspect this is playing a role. Patient endorses that she has recently cut out pork. Will continue to work on reducing salt and refining diet. Will start HCTZ today. -Start HCTZ 12.'5mg'$  daily -f/u BMP

## 2022-08-18 NOTE — Assessment & Plan Note (Signed)
HIV and HCV screening done today. Patient to follow up for pap smear with female provider.

## 2022-08-18 NOTE — Assessment & Plan Note (Addendum)
Doing well on elocon cream,refills ordered. Patient endorsing some itching of the scalp along the periphery with hair loss. On exam scalp has minimal flaking and appears well hydrated, no alopecia or broken hair shafts or scale to suggest fungal infection, no oily skin lesions to suggest seborrheic dermatitis. Says she gets relief with Ciclopirox shampoo, refilled. Also recommended head and shoulders vs keralyte for OTC options.

## 2022-08-18 NOTE — Assessment & Plan Note (Addendum)
Patient noted to have tachycardia up to 120bpm on routine clinic vitals check. EKG performed showing sinus tachycardia with LAE. Q wave and T wave inversion seen in lead III which can be normal variants. No other ST or T wave changes noted. Patient is diaphoretic on exam as well with some tremor of the hands. Will check a TSH to ensure it is not the thyroid given sinus tach, hair loss, diaphoresis, hand tremor although patient has had weight gain recently which is not consistent. Given LAE will also get echo to evaluate any other structural changes. 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 Consider beta blocker for BP and heart rate control at future visits. Patient denies lightheadedness, dizziness, SOB, CP. HDS. -f/u TSH -f/u echo

## 2022-08-18 NOTE — Progress Notes (Signed)
Established Patient Office Visit  Subjective   Patient ID: Alicia Rodriguez, female    DOB: Nov 10, 1990  Age: 32 y.o. MRN: [id number]  Chief Complaint  Patient presents with   Follow-up    FOLLOW UP RASH -skin and in hair.    Alicia Rodriguez is a 32 y/o female with a pmh outlined below. Please see A&P for HPI information.      Review of Systems  All other systems reviewed and are negative.     Objective:     BP (!) 141/79 (BP Location: Right Arm, Patient Position: Sitting, Cuff Size: Normal)   Pulse 92   Temp 98 F (36.7 C)   Ht '5\' 2"'$  (1.575 m)   Wt 196 lb 8 oz (89.1 kg)   SpO2 100%   BMI 35.94 kg/m    Physical Exam Constitutional:      General: She is not in acute distress.    Appearance: Normal appearance. She is obese.  Eyes:     General: No scleral icterus.    Conjunctiva/sclera: Conjunctivae normal.  Neck:     Comments: No thyromegaly or palpable thyroid nodules Cardiovascular:     Rate and Rhythm: Regular rhythm. Tachycardia present.     Pulses: Normal pulses.     Heart sounds: Normal heart sounds. No murmur heard.    No gallop.  Pulmonary:     Effort: Pulmonary effort is normal. No respiratory distress.     Breath sounds: Normal breath sounds. No wheezing or rales.  Musculoskeletal:     Cervical back: Normal range of motion and neck supple.     Right lower leg: No edema.     Left lower leg: No edema.  Lymphadenopathy:     Cervical: No cervical adenopathy.  Skin:    General: Skin is warm.     Capillary Refill: Capillary refill takes less than 2 seconds.     Comments: Diaphoretic. Skin without rashes, scalp without flaking, scale, broken hair shafts, alopecia or other skin lesions.  Neurological:     Mental Status: She is alert.     Comments: Fine hand tremor      Results for orders placed or performed in visit on 08/17/22  BMP8+Anion Gap  Result Value Ref Range   Glucose WILL FOLLOW    BUN WILL FOLLOW    Creatinine, Ser WILL FOLLOW     eGFR WILL FOLLOW    BUN/Creatinine Ratio WILL FOLLOW    Sodium WILL FOLLOW    Potassium WILL FOLLOW    Chloride WILL FOLLOW    CO2 WILL FOLLOW    Anion Gap WILL FOLLOW    Calcium WILL FOLLOW   TSH  Result Value Ref Range   TSH WILL FOLLOW   HIV antibody (with reflex)  Result Value Ref Range   HIV Screen 4th Generation wRfx Non Reactive Non Reactive  Hepatitis C Ab reflex to Quant PCR  Result Value Ref Range   HCV Ab Non Reactive Non Reactive  Interpretation:  Result Value Ref Range   HCV Interp 1: Comment       The ASCVD Risk score (Arnett DK, et al., 2019) failed to calculate for the following reasons:   The 2019 ASCVD risk score is only valid for ages 27 to 60    Assessment & Plan:   Problem List Items Addressed This Visit       Cardiovascular and Mediastinum   Hypertension    BP elevated to 141/79 in clinic. Patient presents  after being told by dentist that BP was high and needed to be controlled prior to wisdom teeth removal and root canal. On review has had elevated BP in prior visits. Patient endorses weight gain over past year related to her diet. On chart review 146 lbs 04/2021 to 196lbs 08/2022, do suspect this is playing a role. Patient endorses that she has recently cut out pork. Will continue to work on reducing salt and refining diet. Will start HCTZ today. -Start HCTZ 12.'5mg'$  daily -f/u BMP      Relevant Medications   hydrochlorothiazide (MICROZIDE) 12.5 MG capsule     Musculoskeletal and Integument   Atopic dermatitis    Doing well on elocon cream,refills ordered. Patient endorsing some itching of the scalp along the periphery with hair loss. On exam scalp has minimal flaking and appears well hydrated, no alopecia or broken hair shafts or scale to suggest fungal infection, no oily skin lesions to suggest seborrheic dermatitis. Says she gets relief with Ciclopirox shampoo, refilled. Also recommended head and shoulders vs keralyte for OTC options.       Relevant Medications   Ciclopirox 1 % shampoo   mometasone (ELOCON) 0.1 % cream     Other   Healthcare maintenance    HIV and HCV screening done today. Patient to follow up for pap smear with female provider.      Sinus tachycardia by electrocardiogram    Patient noted to have tachycardia up to 120bpm on routine clinic vitals check. EKG performed showing sinus tachycardia with LAE. Q wave and T wave inversion seen in lead III which can be normal variants. No other ST or T wave changes noted. Patient is diaphoretic on exam as well with some tremor of the hands. Will check a TSH to ensure it is not the thyroid given sinus tach, hair loss, diaphoresis, hand tremor although patient has had weight gain recently which is not consistent. Given LAE will also get echo to evaluate any other structural changes. 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 Consider beta blocker for BP and heart rate control at future visits. Patient denies lightheadedness, dizziness, SOB, CP. HDS. -f/u TSH -f/u echo      Other Visit Diagnoses     Tachycardia    -  Primary   Relevant Medications   hydrochlorothiazide (MICROZIDE) 12.5 MG capsule   Other Relevant Orders   BMP8+Anion Gap (Completed)   EKG 12-Lead (Completed)   TSH (Completed)   ECHOCARDIOGRAM COMPLETE   Diaphoresis       Relevant Orders   TSH (Completed)   Left atrial enlargement       Relevant Medications   hydrochlorothiazide (MICROZIDE) 12.5 MG capsule   Other Relevant Orders   ECHOCARDIOGRAM COMPLETE   Encounter for screening for HIV       Relevant Orders   HIV antibody (with reflex) (Completed)   Encounter for HCV screening test for low risk patient       Relevant Orders   Hepatitis C Ab reflex to Quant PCR (Completed)   Seborrheic dermatitis of scalp           Return in about 4 weeks (around 09/14/2022).    Iona Coach, MD

## 2022-08-19 LAB — TSH: TSH: 2.72 u[IU]/mL (ref 0.450–4.500)

## 2022-08-19 LAB — HIV ANTIBODY (ROUTINE TESTING W REFLEX): HIV Screen 4th Generation wRfx: NONREACTIVE

## 2022-08-19 LAB — HCV INTERPRETATION

## 2022-08-19 LAB — BMP8+ANION GAP
Anion Gap: 17 mmol/L (ref 10.0–18.0)
BUN/Creatinine Ratio: 15 (ref 9–23)
BUN: 10 mg/dL (ref 6–20)
CO2: 19 mmol/L — ABNORMAL LOW (ref 20–29)
Calcium: 9.5 mg/dL (ref 8.7–10.2)
Chloride: 104 mmol/L (ref 96–106)
Creatinine, Ser: 0.68 mg/dL (ref 0.57–1.00)
Glucose: 105 mg/dL — ABNORMAL HIGH (ref 70–99)
Potassium: 3.8 mmol/L (ref 3.5–5.2)
Sodium: 140 mmol/L (ref 134–144)
eGFR: 120 mL/min/{1.73_m2} (ref >59)

## 2022-08-19 LAB — HCV AB W REFLEX TO QUANT PCR: HCV Ab: NONREACTIVE

## 2022-08-19 NOTE — Progress Notes (Signed)
Called patient and discussed lab results. Kidney function and electrolytes wnl with starting HCTZ, can repeat BMP at follow up visit. TSH wnl and does not explain tachycardia, will see what the echo shows and can consider beta blockers in the future. No change in management.

## 2022-08-20 NOTE — Progress Notes (Signed)
Internal Medicine Clinic Attending  Case discussed with Dr. Stann Mainland  At the time of the visit.  We reviewed the resident's history and exam and pertinent patient test results.  I agree with the assessment, diagnosis, and plan of care documented in the resident's note.

## 2022-08-28 ENCOUNTER — Telehealth: Payer: Self-pay

## 2022-08-28 NOTE — Telephone Encounter (Signed)
Pa for pt (Ciclopirox 1 % shampoo ) was submitted on cover my meds with last 2 office notes ... Awaiting approval or denial ..       DECISION :    Approved today   PA Case: [id number], Status: Approved,    Coverage Starts on: 08/28/2022 12:00:00 AM, Coverage Ends on: 08/28/2023 12:00:00 AM.   Authorization Expiration Date: 08/27/2023   Drug Ciclopirox 1% shampoo   ePA cloud logo Form [employer] [place]  Medicaid     ( [place] TO SCAN TO CHART  ALSO )

## 2022-08-28 NOTE — Telephone Encounter (Signed)
Pa was done and is approved  will send to pharmacy  now

## 2022-09-07 ENCOUNTER — Encounter: Payer: Medicaid Other | Admitting: Internal Medicine

## 2022-09-07 NOTE — Progress Notes (Deleted)
   CC: pap smear  HPI:[name] Alicia Rodriguez is a 32 y.o. female who presents for evaluation of ***. Please see individual problem based A/P for details.  HTN, atopic dermatitis  Pap smear  Tachycardia - electrolytes and tsh were normal.  Echo was ordered, consider BB? She does smoke, could be related to this. Would say to stop smoking before any further workup.  Depression, PHQ-9: Based on the patients  [place] Visit from 08/17/2022 in [facility]  PHQ-9 Total Score 0      score we have ***.  Past Medical History:  Diagnosis Date   No pertinent past medical history    Review of Systems:   ROS   Physical Exam: There were no vitals filed for this visit.   General: *** HEENT: Conjunctiva nl , antiicteric sclerae, moist mucous membranes, no exudate or erythema Cardiovascular: Normal rate, regular rhythm.  No murmurs, rubs, or gallops Pulmonary : Equal breath sounds, No wheezes, rales, or rhonchi Abdominal: soft, nontender,  bowel sounds present Ext: No edema in lower extremities, no tenderness to palpation of lower extremities.   Assessment & Plan:   See Encounters Tab for problem based charting.  Patient {GC/GE:[id number]::"discussed with","seen with"} Dr. [id number]. Hoffman","Guilloud","Mullen","Narendra","Raines","Vincent","Williams"}

## 2022-10-08 ENCOUNTER — Ambulatory Visit (HOSPITAL_COMMUNITY): Payer: Medicaid Other

## 2022-10-19 ENCOUNTER — Encounter: Payer: Medicaid Other | Admitting: Student

## 2022-10-22 ENCOUNTER — Other Ambulatory Visit: Payer: Self-pay

## 2022-10-22 DIAGNOSIS — R000 Tachycardia, unspecified: Secondary | ICD-10-CM

## 2022-10-22 NOTE — Telephone Encounter (Signed)
Next appt scheduled 10/28/22 with Dr Elaina Pattee.

## 2022-10-28 ENCOUNTER — Encounter: Payer: Medicaid Other | Admitting: Student

## 2022-12-28 ENCOUNTER — Ambulatory Visit: Payer: Self-pay

## 2022-12-28 ENCOUNTER — Other Ambulatory Visit: Payer: Self-pay | Admitting: Student

## 2022-12-28 DIAGNOSIS — R000 Tachycardia, unspecified: Secondary | ICD-10-CM

## 2023-03-04 ENCOUNTER — Other Ambulatory Visit: Payer: Self-pay | Admitting: Student

## 2023-03-04 DIAGNOSIS — R000 Tachycardia, unspecified: Secondary | ICD-10-CM

## 2023-03-04 DIAGNOSIS — L2089 Other atopic dermatitis: Secondary | ICD-10-CM

## 2023-03-05 MED ORDER — MOMETASONE FUROATE 0.1 % EX CREA: 1.0000 | TOPICAL_CREAM | Freq: Every day | CUTANEOUS | 3 refills | Status: AC

## 2023-03-05 NOTE — Telephone Encounter (Signed)
LOV - 08/17/22. I called pt to schedule an appt; she stated she does not drive so she has to call her aunt. And she will send a message via My Chart. Also stated she needs a refill on Mometasone cream.

## 2023-03-16 ENCOUNTER — Ambulatory Visit
Admission: EM | Admit: 2023-03-16 | Discharge: 2023-03-16 | Disposition: A | Discharge status: Home / Self Care | Payer: Medicaid Other | Attending: Internal Medicine | Admitting: Internal Medicine

## 2023-03-16 ENCOUNTER — Other Ambulatory Visit: Payer: Self-pay

## 2023-03-16 DIAGNOSIS — N611 Abscess of the breast and nipple: Secondary | ICD-10-CM

## 2023-03-16 MED ORDER — SULFAMETHOXAZOLE-TRIMETHOPRIM 800-160 MG PO TABS: 1.0000 | ORAL_TABLET | Freq: Two times a day (BID) | ORAL | 0 refills | Status: AC

## 2023-03-16 NOTE — ED Provider Notes (Signed)
[facility]    CSN: [id number] Arrival date & time: 03/16/23  1417      History   Chief Complaint Chief Complaint  Patient presents with   Breast Pain    Left breast tenderness and swelling x5 days    HPI Alicia Rodriguez is a 32 y.o. female.   Patient presents with swelling and pain to the nipple of the left breast that started about 5 days ago.  Denies any trauma to the breast.  Denies any nipple discharge.  Patient states that she is not currently breast-feeding any children.  Denies any associated fever.     Past Medical History:  Diagnosis Date   No pertinent past medical history     Patient Active Problem List   Diagnosis Date Noted   Sinus tachycardia by electrocardiogram 08/18/2022   Hypertension 08/18/2022   Healthcare maintenance 05/01/2021   Atopic dermatitis 05/01/2021    Past Surgical History:  Procedure Laterality Date   TONSILLECTOMY Bilateral    WISDOM TOOTH EXTRACTION      OB History     Gravida  0   Para      Term      Preterm      AB      Living         SAB      IAB      Ectopic      Multiple      Live Births               Home Medications    Prior to Admission medications   Medication Sig Start Date End Date Taking? Authorizing Provider  hydrochlorothiazide (MICROZIDE) 12.5 MG capsule TAKE 1 CAPSULE BY MOUTH EVERY DAY 03/05/23  Yes Modena Slater, DO  sulfamethoxazole-trimethoprim (BACTRIM DS) 800-160 MG tablet Take 1 tablet by mouth 2 (two) times daily for 7 days. 03/16/23 03/23/23 Yes Hulan Szumski, Acie Fredrickson, FNP  cetirizine (ZYRTEC ALLERGY) 10 MG tablet Take 1 tablet (10 mg total) by mouth daily. 06/15/20   Hall-Potvin, [place], PA-C  Ciclopirox 1 % shampoo APPLY 1 APPLICATION TOPICALLY EVERY DAY AT BEDTIME 10/13/22   Adron Bene, MD  clindamycin (CLEOCIN) 300 MG capsule Take 1 capsule (300 mg total) by mouth 4 (four) times daily. X 7 days 03/18/21   Rolan Bucco, MD  fluticasone (FLONASE) 50 MCG/ACT nasal  spray Place 1 spray into both nostrils daily. 06/15/20   Hall-Potvin, [place], PA-C  HYDROcodone-acetaminophen (NORCO/VICODIN) 5-325 MG tablet Take 1 tablet by mouth every 6 (six) hours as needed for severe pain. 03/02/21   Wallis Bamberg, PA-C  ibuprofen (ADVIL) 600 MG tablet Take 1 tablet (600 mg total) by mouth every 6 (six) hours as needed. 10/18/20   Lamptey, Britta Mccreedy, MD  mometasone (ELOCON) 0.1 % cream Apply 1 Application topically daily. 03/05/23   Modena Slater, DO  naproxen (NAPROSYN) 500 MG tablet Take 1 tablet (500 mg total) by mouth 2 (two) times daily with a meal. 03/02/21   Wallis Bamberg, PA-C  omeprazole (PRILOSEC) 20 MG capsule Take 1 capsule (20 mg total) by mouth daily. 01/03/20   Moshe Cipro, FNP  predniSONE (DELTASONE) 10 MG tablet Take 2 tablets (20 mg total) by mouth daily. 03/02/21   Theron Arista, PA-C    Family History Family History  Problem Relation Age of Onset   Hyperlipidemia Mother    Hypertension Mother    Diabetes Mother    Other Neg Hx     Social History Social History  Tobacco Use   Smoking status: Every Day    Current packs/day: 0.50    Average packs/day: 0.5 packs/day for 2.0 years (1.0 ttl pk-yrs)    Types: Cigarettes   Smokeless tobacco: Current  Vaping Use   Vaping status: Never Used  Substance Use Topics   Alcohol use: Not Currently   Drug use: No     Allergies   Penicillins   Review of Systems Review of Systems Per HPI  Physical Exam Triage Vital Signs ED Triage Vitals  Encounter Vitals Group     BP 03/16/23 1603 (!) 140/86     Systolic BP Percentile --      Diastolic BP Percentile --      Pulse Rate 03/16/23 1603 (!) 113     Resp 03/16/23 1603 17     Temp 03/16/23 1603 98.8 F (37.1 C)     Temp Source 03/16/23 1603 Oral     SpO2 03/16/23 1603 100 %     Weight 03/16/23 1601 195 lb (88.5 kg)     Height 03/16/23 1601 5\' 2"  (1.575 m)     Head Circumference --      Peak Flow --      Pain Score 03/16/23 1601 6     Pain Loc --       Pain Education --      Exclude from Growth Chart --    No data found.  Updated Vital Signs BP (!) 140/86 (BP Location: Right Arm)   Pulse (!) 113   Temp 98.8 F (37.1 C) (Oral)   Resp 17   Ht 5\' 2"  (1.575 m)   Wt 195 lb (88.5 kg)   LMP 03/02/2023   SpO2 100%   BMI 35.67 kg/m   Visual Acuity Right Eye Distance:   Left Eye Distance:   Bilateral Distance:    Right Eye Near:   Left Eye Near:    Bilateral Near:     Physical Exam Exam conducted with a chaperone present.  Constitutional:      General: She is not in acute distress.    Appearance: Normal appearance. She is not toxic-appearing or diaphoretic.  HENT:     Head: Normocephalic and atraumatic.  Eyes:     Extraocular Movements: Extraocular movements intact.     Conjunctiva/sclera: Conjunctivae normal.  Pulmonary:     Effort: Pulmonary effort is normal.  Chest:     Comments: Patient's left nipple is swollen and mildly erythematous.  There is no obvious drainage noted.  It does not extend to the breast or areola. Neurological:     General: No focal deficit present.     Mental Status: She is alert and oriented to person, place, and time. Mental status is at baseline.  Psychiatric:        Mood and Affect: Mood normal.        Behavior: Behavior normal.        Thought Content: Thought content normal.        Judgment: Judgment normal.      UC Treatments / Results  Labs (all labs ordered are listed, but only abnormal results are displayed) Labs Reviewed - No data to display  EKG   Radiology No results found.  Procedures Procedures (including critical care time)  Medications Ordered in UC Medications - No data to display  Initial Impression / Assessment and Plan / UC Course  I have reviewed the triage vital signs and the nursing notes.  Pertinent labs & imaging  results that were available during my care of the patient were reviewed by me and considered in my medical decision making (see chart for  details).     Physical exam is consistent with nipple abscess.  Will treat with Bactrim given penicillin allergy.  I do think that imaging is reasonable to rule out any other worrisome etiology so patient's information was sent over to breast imaging center for further evaluation.  Advised patient that if they do not call her within the next few days, she is to call them at provided contact information.  Advised strict follow-up precautions as well.  Patient verbalized understanding and was agreeable with plan. Final Clinical Impressions(s) / UC Diagnoses   Final diagnoses:  Abscess of left nipple     Discharge Instructions      I have prescribed an antibiotic for concern for nipple abscess today.  I have also sent over your information to the breast imaging center for further evaluation.  If they do not call you in the next few days, please call them yourself at provided phone number.    ED Prescriptions     Medication Sig Dispense Auth. Provider   sulfamethoxazole-trimethoprim (BACTRIM DS) 800-160 MG tablet Take 1 tablet by mouth 2 (two) times daily for 7 days. 14 tablet [place], Acie Fredrickson, [place]      PDMP not reviewed this encounter.   Gustavus Bryant, [place] 03/16/23 [phone]

## 2023-03-16 NOTE — ED Triage Notes (Signed)
Pt states that she has some left breast swelling and tenderness. X5 days

## 2023-03-16 NOTE — Discharge Instructions (Signed)
I have prescribed an antibiotic for concern for nipple abscess today.  I have also sent over your information to the breast imaging center for further evaluation.  If they do not call you in the next few days, please call them yourself at provided phone number.

## 2023-03-19 ENCOUNTER — Other Ambulatory Visit: Payer: Self-pay | Admitting: Internal Medicine

## 2023-03-19 DIAGNOSIS — N611 Abscess of the breast and nipple: Secondary | ICD-10-CM

## 2023-03-19 DIAGNOSIS — N644 Mastodynia: Secondary | ICD-10-CM

## 2023-03-29 ENCOUNTER — Ambulatory Visit
Admission: RE | Admit: 2023-03-29 | Discharge: 2023-03-29 | Disposition: A | Discharge status: Home / Self Care | Payer: Medicaid Other | Source: Ambulatory Visit | Attending: Internal Medicine | Admitting: Internal Medicine

## 2023-03-29 ENCOUNTER — Ambulatory Visit: Payer: Medicaid Other

## 2023-03-29 DIAGNOSIS — N611 Abscess of the breast and nipple: Secondary | ICD-10-CM

## 2023-03-29 DIAGNOSIS — N644 Mastodynia: Secondary | ICD-10-CM

## 2023-05-17 DIAGNOSIS — H5213 Myopia, bilateral: Secondary | ICD-10-CM | POA: Diagnosis not present

## 2023-05-19 ENCOUNTER — Other Ambulatory Visit: Payer: Self-pay | Admitting: Student

## 2023-05-19 DIAGNOSIS — R000 Tachycardia, unspecified: Secondary | ICD-10-CM

## 2023-05-19 MED ORDER — HYDROCHLOROTHIAZIDE 12.5 MG PO CAPS: 12.5000 mg | ORAL_CAPSULE | Freq: Every day | ORAL | 1 refills | Status: DC

## 2023-05-19 NOTE — Telephone Encounter (Signed)
LOV - 08/17/22. I called pt to schedule an appt; no answer,unable to leave a message "Call cannot be completed".

## 2023-05-19 NOTE — Telephone Encounter (Signed)
  hydrochlorothiazide (MICROZIDE) 12.5 MG capsule  [id number] - [place], [place] - [address]

## 2023-07-20 ENCOUNTER — Encounter: Payer: Self-pay | Admitting: Nurse Practitioner

## 2023-07-30 ENCOUNTER — Other Ambulatory Visit: Payer: Self-pay | Admitting: Student

## 2023-07-30 DIAGNOSIS — R000 Tachycardia, unspecified: Secondary | ICD-10-CM

## 2023-09-01 ENCOUNTER — Other Ambulatory Visit: Payer: Self-pay | Admitting: Student

## 2023-09-01 DIAGNOSIS — R000 Tachycardia, unspecified: Secondary | ICD-10-CM

## 2023-09-01 NOTE — Telephone Encounter (Signed)
 LOV - 08/17/22. Pt called to schedule an appt; she's agreeable. Call transferred to the front office - appt schedule w/Dr Ninfa Meeker 09/13/23.

## 2023-09-06 ENCOUNTER — Encounter: Payer: Self-pay | Admitting: Nurse Practitioner

## 2023-09-06 ENCOUNTER — Ambulatory Visit: Payer: Medicaid Other | Admitting: Nurse Practitioner

## 2023-09-06 VITALS — BP 149/90 | HR 120 | Ht 62.0 in | Wt 207.0 lb

## 2023-09-06 DIAGNOSIS — K219 Gastro-esophageal reflux disease without esophagitis: Secondary | ICD-10-CM

## 2023-09-06 DIAGNOSIS — F17210 Nicotine dependence, cigarettes, uncomplicated: Secondary | ICD-10-CM | POA: Diagnosis not present

## 2023-09-06 DIAGNOSIS — R1013 Epigastric pain: Secondary | ICD-10-CM | POA: Diagnosis not present

## 2023-09-06 DIAGNOSIS — R000 Tachycardia, unspecified: Secondary | ICD-10-CM

## 2023-09-06 DIAGNOSIS — R112 Nausea with vomiting, unspecified: Secondary | ICD-10-CM | POA: Diagnosis not present

## 2023-09-06 MED ORDER — PANTOPRAZOLE SODIUM 40 MG PO TBEC: 40.0000 mg | DELAYED_RELEASE_TABLET | Freq: Every day | ORAL | 2 refills | Status: DC

## 2023-09-06 MED ORDER — FAMOTIDINE 20 MG PO TABS: 20.0000 mg | ORAL_TABLET | Freq: Every day | ORAL | 1 refills | Status: DC

## 2023-09-06 NOTE — Progress Notes (Signed)
 09/06/2023 Alicia Rodriguez [id number] July 31, 1990   CHIEF COMPLAINT: Nausea, vomiting and epigastric pain   HISTORY OF PRESENT ILLNESS: Alicia Rodriguez is a 33 year old female with a past medical history of obesity and hypertension.  Past tonsillectomy at the age of 8 and wisdom teeth extraction.  She presents to our office today self referred for further evaluation regarding nausea, vomiting and epigastric pain x 3 years.  She is accompanied by her mother.  She awakens every morning with a sharp epigastric pain with nausea and vomiting 4 days weekly, however, for the past year she vomits 1-3 times daily.  She describes emesis upon awakening as white foamy or slimy yellow liquid.  Later in the day, she vomits up partially digested food.  No coffee-ground or frank hematemesis.  She has infrequent heartburn.  She describes feeling as if food just sits in her stomach with occasional regurgitation.  No dysphagia.  She has been on Omeprazole 20 mg daily for the past 3 years.  She typically passes a normal brown formed stool daily but if she does not vomit up acid secretions in the morning she will pass a loose "slimy" stool.  No bloody or black stools.  No NSAID use.  She has gained 67 pounds over the past year.  She is tachycardic on exam, initial heart rate was 136 b/min, repeat heart rate 120 b/min.  BP 149/90.  She denies having any chest pain, palpitations, dizziness or shortness of breath.  She was previously found to be tachycardic with a heart rate of 120 and elevated BP 141/79 per PCP during an office visit 08/17/2022. She was started on Hydrochlorothiazide 12.5 mg 1 p.o. daily for hypertension and a beta-blocker was considered if she continued to be tachycardic.  She drinks two 8 ounce cups of caffeinated coffee daily and drinks soda several days weekly.  She is scheduled to have routine laboratory studies with her PCP on 09/13/2023.       Latest Ref Rng & Units 03/18/2021    4:28 PM 12/20/2017     4:30 PM 06/28/2016    4:50 PM  CBC  WBC 4.0 - 10.5 K/uL 15.8  12.3  16.0   Hemoglobin 12.0 - 15.0 g/dL 78.2  95.6  21.3   Hematocrit 36.0 - 46.0 % 41.0  40.2  41.7   Platelets 150 - 400 K/uL 387  334  340        Latest Ref Rng & Units 08/17/2022    4:31 PM 03/18/2021    4:28 PM 12/20/2017    4:30 PM  CMP  Glucose 70 - 99 mg/dL 086  578  469   BUN 6 - 20 mg/dL 10  8  15    Creatinine 0.57 - 1.00 mg/dL 6.29  5.28  4.13   Sodium 134 - 144 mmol/L 140  136  139   Potassium 3.5 - 5.2 mmol/L 3.8  3.4  3.5   Chloride 96 - 106 mmol/L 104  105  107   CO2 20 - 29 mmol/L 19  23  23    Calcium 8.7 - 10.2 mg/dL 9.5  9.1  9.5   Total Protein 6.5 - 8.1 g/dL   7.8   Total Bilirubin 0.3 - 1.2 mg/dL   0.5   Alkaline Phos 38 - 126 U/L   54   AST 15 - 41 U/L   20   ALT 0 - 44 U/L   14  Past Medical History:  Diagnosis Date   No pertinent past medical history    Past Surgical History:  Procedure Laterality Date   TONSILLECTOMY Bilateral    WISDOM TOOTH EXTRACTION    Umbilical boil status post I&D 2018  Social History: She smokes cigarettes 1/2 pack daily.  No alcohol use.  No drug use.  Family History: Mother with DM type II, hypertension and hyperlipidemia.  Paternal family history unknown.  No known family history of esophageal, gastric or colon cancer. Maternal grandmother cervical cancer.   Allergies  Allergen Reactions   Penicillins Swelling      Outpatient Encounter Medications as of 09/06/2023  Medication Sig   hydrochlorothiazide (MICROZIDE) 12.5 MG capsule TAKE 1 CAPSULE BY MOUTH EVERY DAY   mometasone (ELOCON) 0.1 % cream Apply 1 Application topically daily.   omeprazole (PRILOSEC) 20 MG capsule Take 1 capsule (20 mg total) by mouth daily.   [DISCONTINUED] cetirizine (ZYRTEC ALLERGY) 10 MG tablet Take 1 tablet (10 mg total) by mouth daily.   [DISCONTINUED] fluticasone (FLONASE) 50 MCG/ACT nasal spray Place 1 spray into both nostrils daily.   [DISCONTINUED] ibuprofen (ADVIL)  600 MG tablet Take 1 tablet (600 mg total) by mouth every 6 (six) hours as needed.   [DISCONTINUED] predniSONE (DELTASONE) 10 MG tablet Take 2 tablets (20 mg total) by mouth daily.   [DISCONTINUED] Ciclopirox 1 % shampoo APPLY 1 APPLICATION TOPICALLY EVERY DAY AT BEDTIME   [DISCONTINUED] clindamycin (CLEOCIN) 300 MG capsule Take 1 capsule (300 mg total) by mouth 4 (four) times daily. X 7 days   [DISCONTINUED] HYDROcodone-acetaminophen (NORCO/VICODIN) 5-325 MG tablet Take 1 tablet by mouth every 6 (six) hours as needed for severe pain.   [DISCONTINUED] naproxen (NAPROSYN) 500 MG tablet Take 1 tablet (500 mg total) by mouth 2 (two) times daily with a meal.   No facility-administered encounter medications on file as of 09/06/2023.   REVIEW OF SYSTEMS:  Gen: Denies fever, sweats or chills. + Weight gain. CV: + Tachycardia. Denies chest pain, palpitations or edema. Resp: Denies cough, shortness of breath of hemoptysis.  GI: See HPI.  GU: Denies urinary burning, blood in urine, increased urinary frequency or incontinence. MS: Denies joint pain, muscles aches or weakness. Derm: Denies rash, itchiness, skin lesions or unhealing ulcers. Psych: Denies depression, anxiety, memory loss or confusion. Heme: Denies bruising, easy bleeding. Neuro:  Denies headaches, dizziness or paresthesias. Endo:  Denies any problems with DM, thyroid or adrenal function.  PHYSICAL EXAM: Ht 5\' 2"  (1.575 m)   Wt 207 lb (93.9 kg)   BMI 37.86 kg/m  LMP 2/28 - 08/18/2023.  History of irregular menstrual cycles Sexually active, does not use birth control  General: 33 year old female in no acute distress. Head: Normocephalic and atraumatic. Eyes:  Sclerae non-icteric, conjunctive pink. Ears: Normal auditory acuity. Mouth: Dentition intact. No ulcers or lesions.  Neck: Supple, no lymphadenopathy or thyromegaly.  Lungs: Clear bilaterally to auscultation without wheezes, crackles or rhonchi. Heart: Regular rate and  rhythm. No murmur, rub or gallop appreciated.  Abdomen: Soft, nontender, nondistended. No masses. No hepatosplenomegaly. Normoactive bowel sounds x 4 quadrants.  Rectal: Deferred. Musculoskeletal: Symmetrical with no gross deformities. Skin: Warm and dry. No rash or lesions on visible extremities. Extremities: No edema. Neurological: Alert oriented x 4, no focal deficits.  Psychological:  Alert and cooperative. Normal mood and affect.  ASSESSMENT AND PLAN:  33 year old female with a history of GERD with N/V and epigastric pain x 3 years, vomiting has progressively worsened over the  past year. -Stop Omeprazole -Start Pantoprazole 40 mg daily to be taken 30 minutes before breakfast -Start Famotidine 20 mg 1 tab p.o. nightly -Diatherix H. pylori stool antigen -EGD benefits and risks discussed including risk with sedation, risk of bleeding, perforation and infection.  Schedule EGD after cardiac clearance received. -Check serum beta-hCG level  2 to 3 days prior to future EGD date -GERD diet -RUQ sonogram to evaluate the gallbladder  -Weight loss recommended -Patient is scheduled to have labs with her PCP 09/13/2023, labs to include a CBC, CMET and lipase level -Patient to contact office if symptoms worsen  Tachycardia, heart rate 136 b/min -> 120 b/min.  Asymptomatic. -Cardiology referral to evaluate tachycardia, to also include cardiac clearance which is requested prior to scheduling an EGD -Recommended decreasing coffee to 1 cup daily and stop drinking caffeinated sodas  Hypertension, on Hydrochlorothiazide 12.5 mg daily  Chronic tobacco use -Follow-up with PCP for smoking cessation         CC:  Modena Slater, DO

## 2023-09-06 NOTE — Progress Notes (Signed)
 Agree with assessment and plan as outlined.

## 2023-09-06 NOTE — Patient Instructions (Addendum)
 You will be contacted by [facility] Scheduling in the next 2 days to arrange a Ultrasound.  The number on your caller ID will be [phone], please answer when they call.  If you have not heard from them in 2 days please call [phone] to schedule.    Stop Omeprazole.  Your provider has ordered "Diatherix" stool testing for you. You have received a kit from our office today containing all necessary supplies to complete this test. Please carefully read the stool collection instructions provided in the kit before opening the accompanying materials. In addition, be sure there is a label providing your full name and date of birth on the "puritan opti-swab" tube that is supplied in the kit (if you do not see a label with this information on your test tube, please make [place] aware before test collection!). After completing the test, you should secure the purtian tube into the specimen biohazard bag. The [facility] E-Req sheet (including date and time of specimen collection) should be placed into the outside pocket of the specimen biohazard bag and returned to the [place] lab (basement floor of [facility]) within 3 days of collection. Please make sure to give the specimen to a staff member at the lab. DO NOT leave the specimen on the counter.   If the specimen date and time (can be found in the upper right boxed portion of the sheet) are not filled out on the E-Req sheet, the test will NOT be performed.   Due to recent changes in healthcare laws, you may see the results of your imaging and laboratory studies on MyChart before your provider has had a chance to review them.  We understand that in some cases there may be results that are confusing or concerning to you. Not all laboratory results come back in the same time frame and the provider may be waiting for multiple results in order to interpret others.  Please give [place] 48 hours in order for your provider to thoroughly  review all the results before contacting the office for clarification of your results.   Thank you for trusting me with your gastrointestinal care!   Alcide Evener, CRNP

## 2023-09-13 ENCOUNTER — Encounter: Payer: Self-pay | Admitting: Student

## 2023-09-13 ENCOUNTER — Ambulatory Visit: Admitting: Student

## 2023-09-13 VITALS — BP 169/108 | HR 124 | Temp 99.3°F | Wt 205.8 lb

## 2023-09-13 DIAGNOSIS — I10 Essential (primary) hypertension: Secondary | ICD-10-CM | POA: Diagnosis not present

## 2023-09-13 DIAGNOSIS — Z124 Encounter for screening for malignant neoplasm of cervix: Secondary | ICD-10-CM

## 2023-09-13 DIAGNOSIS — R1013 Epigastric pain: Secondary | ICD-10-CM

## 2023-09-13 DIAGNOSIS — R000 Tachycardia, unspecified: Secondary | ICD-10-CM

## 2023-09-13 DIAGNOSIS — E876 Hypokalemia: Secondary | ICD-10-CM | POA: Diagnosis not present

## 2023-09-13 DIAGNOSIS — R635 Abnormal weight gain: Secondary | ICD-10-CM

## 2023-09-13 MED ORDER — HYDROCHLOROTHIAZIDE 25 MG PO TABS: 25.0000 mg | ORAL_TABLET | Freq: Every day | ORAL | 11 refills | Status: DC

## 2023-09-13 MED ORDER — CARVEDILOL 3.125 MG PO TABS: 3.1250 mg | ORAL_TABLET | Freq: Two times a day (BID) | ORAL | 11 refills | Status: AC

## 2023-09-13 NOTE — Progress Notes (Unsigned)
   CC: Follow up HTN  HPI:  [name] is a 33 y.o. female with a PMH stated below who presents today for follow up of hypertension after missing an appointment one year ago. She reports an upcoming appointment with cardiology for known regular tachycardia and is working with GI for chronic abdominal pain.  Please see problem based assessment and plan for additional details.  Past Medical History:  Diagnosis Date   Hypertension     Review of Systems: ROS negative except for what is noted on the assessment and plan.  Vitals:   09/13/23 1554 09/13/23 1619  BP: (!) 170/106 (!) 169/108  Pulse: (!) 136 (!) 124  Temp: 99.3 F (37.4 C)   TempSrc: Oral   Weight: 205 lb 12.8 oz (93.4 kg)     Physical Exam: Constitutional: well-appearing woman in no acute distress Cardiovascular: tachycardic with regular rhythm, no m/r/g Pulmonary/Chest: normal work of breathing on room air, lungs clear to auscultation bilaterally Abdominal: soft, non-tender, non-distended MSK: normal bulk and tone Neurological: alert & oriented x 3, no focal deficit Skin: warm and dry Psych: normal mood and behavior  Assessment & Plan:   Patient discussed with Dr. Sol Rodriguez  Hypertension 1 year ago was started on hydrochlorothiazide but was since lost to follow-up.  Today her blood pressure is uncontrolled, 169/108.  She is tachycardic, this was identified over a year ago, and she has an upcoming cardiology appointment.  Otherwise she is asymptomatic.  She does not check her blood pressure at home. Sometime her GI illness causes her to vomit her pill - of note she thinks the wrapping of the capsule pill is bothersome so will change to tablets. - Will increase hydrochlorothiazide 12.5 to 25 daily - Will start Coreg 3.125 BID per tachycardia - RTC in 1 month for BP check  Abdominal pain, epigastric She sees gastroenterology for this.  Reports about 3 years of recurrent epigastric abdominal pain.  GI requested  some labs be drawn today further workup. - Collect CMP, CBC, lipase for GI  Sinus tachycardia by electrocardiogram This was noted about a year ago and plans were made for cardiology appointment.  It has not occurred.  She does however have an appointment coming up in June of this year.  In office rate is between 120 and 130.  She notices her rate when she exerts herself or is stressed.  She does not appreciate irregular rhythms.  It is regular today.  Prior EKGs unrevealing. - Start Coreg 3.125 twice daily - See cardiology in June  Labs collected today at request of GI  Will refer to OBGYN for women's health visit and cervical cancer screen at pt request.  Alicia Rodriguez, D.O. [facility], PGY-1 Phone: [phone] Date 09/14/2023 Time 2:04 PM

## 2023-09-13 NOTE — Assessment & Plan Note (Signed)
 BP high 170/106  Will increase hydrochlorothiazide to 25 daily Will start Coreg 3.125 BID

## 2023-09-14 LAB — CMP14 + ANION GAP
ALT: 17 IU/L (ref 0–32)
AST: 20 IU/L (ref 0–40)
Albumin: 4.4 g/dL (ref 3.9–4.9)
Alkaline Phosphatase: 86 IU/L (ref 44–121)
Anion Gap: 16 mmol/L (ref 10.0–18.0)
BUN/Creatinine Ratio: 11 (ref 9–23)
BUN: 9 mg/dL (ref 6–20)
Bilirubin Total: 0.4 mg/dL (ref 0.0–1.2)
CO2: 19 mmol/L — ABNORMAL LOW (ref 20–29)
Calcium: 9.5 mg/dL (ref 8.7–10.2)
Chloride: 102 mmol/L (ref 96–106)
Creatinine, Ser: 0.79 mg/dL (ref 0.57–1.00)
Globulin, Total: 2.8 g/dL (ref 1.5–4.5)
Glucose: 111 mg/dL — ABNORMAL HIGH (ref 70–99)
Potassium: 3.1 mmol/L — ABNORMAL LOW (ref 3.5–5.2)
Sodium: 137 mmol/L (ref 134–144)
Total Protein: 7.2 g/dL (ref 6.0–8.5)
eGFR: 102 mL/min/{1.73_m2} (ref >59)

## 2023-09-14 LAB — LIPASE: Lipase: 23 U/L (ref 14–72)

## 2023-09-14 LAB — CBC
Hematocrit: 43.8 % (ref 34.0–46.6)
Hemoglobin: 14.9 g/dL (ref 11.1–15.9)
MCH: 29 pg (ref 26.6–33.0)
MCHC: 34 g/dL (ref 31.5–35.7)
MCV: 85 fL (ref 79–97)
Platelets: 528 10*3/uL — ABNORMAL HIGH (ref 150–450)
RBC: 5.14 x10E6/uL (ref 3.77–5.28)
RDW: 14.9 % (ref 11.7–15.4)
WBC: 18.4 10*3/uL — ABNORMAL HIGH (ref 3.4–10.8)

## 2023-09-14 LAB — TSH: TSH: 2.05 u[IU]/mL (ref 0.450–4.500)

## 2023-09-14 NOTE — Assessment & Plan Note (Signed)
 She sees gastroenterology for this.  Reports about 3 years of recurrent epigastric abdominal pain.  GI requested some labs be drawn today further workup. - Collect CMP, CBC, lipase for GI

## 2023-09-14 NOTE — Assessment & Plan Note (Addendum)
 This was noted about a year ago and plans were made for cardiology appointment.  It has not occurred.  She does however have an appointment coming up in June of this year.  In office rate is between 120 and 130.  She notices her rate when she exerts herself or is stressed.  She does not appreciate irregular rhythms.  No syncope, pain, dizziness, dyspnea. Not irregular today.  Prior EKGs sinus tachy with LAE. - Start Coreg 3.125 twice daily - See cardiology in June - Recheck TSH

## 2023-09-15 NOTE — Progress Notes (Signed)
 Internal Medicine Clinic Attending  Case discussed with the resident at the time of the visit.  We reviewed the resident's history and exam and pertinent patient test results.  I agree with the assessment, diagnosis, and plan of care documented in the resident's note.

## 2023-09-17 MED ORDER — POTASSIUM CHLORIDE CRYS ER 20 MEQ PO TBCR: 20.0000 meq | EXTENDED_RELEASE_TABLET | Freq: Three times a day (TID) | ORAL | 0 refills | Status: AC

## 2023-09-17 MED ORDER — AMLODIPINE BESYLATE 5 MG PO TABS: 5.0000 mg | ORAL_TABLET | Freq: Every day | ORAL | 11 refills | Status: DC

## 2023-09-17 NOTE — Addendum Note (Signed)
 Addended by: Katheran James on: 09/17/2023 11:26 AM   Modules accepted: Orders

## 2023-09-17 NOTE — Addendum Note (Signed)
 Addended by: Katheran James on: 09/17/2023 11:53 AM   Modules accepted: Orders

## 2023-10-11 ENCOUNTER — Encounter: Admitting: Student

## 2023-11-16 ENCOUNTER — Other Ambulatory Visit: Payer: Self-pay | Admitting: Nurse Practitioner

## 2023-12-03 NOTE — Progress Notes (Unsigned)
 Cardiology Office Note:   Date:  12/06/2023  ID:  Alicia Rodriguez, DOB [birth year], MRN [id number] PCP:  Tobie Gaines, DO  [facility] Providers Cardiologist:  Wendel Haws, MD Referring MD: Tobie Gaines, DO  Chief Complaint/Reason for Referral:  Tachycardia ASSESSMENT:    1. Sinus tachycardia   2. Preoperative cardiovascular examination   3. Tobacco abuse   4. Primary hypertension     PLAN:   In order of problems listed above: Sinus tachycardia: Will obtain echocardiogram and monitor to evaluate further.  Given history of chronic abdominal pain with frequent vomiting (one to three times daily over the last year) with hypokalemia this is likely due to volume depletion as well as pain in the context of daily caffeine use.  Anxiety and tobacco abuse likely also contributing. Preoperative cardiovascular semination: EGD is low risk procedure cardiovascular perspective; no further evaluation is required. Tobacco abuse: Stressed need for abstinence. Hypertension: Increase amlodipine  to 10 mg.            Dispo:  Return if symptoms worsen or fail to improve.      Medication Adjustments/Labs and Tests Ordered: Current medicines are reviewed at length with the patient today.  Concerns regarding medicines are outlined above.  The following changes have been made:     Labs/tests ordered: Orders Placed This Encounter  Procedures   LONG TERM MONITOR (3-14 DAYS)   EKG 12-Lead   ECHOCARDIOGRAM COMPLETE    Medication Changes: Meds ordered this encounter  Medications   amLODipine  (NORVASC ) 10 MG tablet    Sig: Take 1 tablet (10 mg total) by mouth daily.    Dispense:  90 tablet    Refill:  3    Current medicines are reviewed at length with the patient today.  The patient does not have concerns regarding medicines.    History of Present Illness:    FOCUSED PROBLEM LIST:   Hypertension Chronic abdominal pain Tobacco abuse BMI 36  June 2025:  Patient consents to use of  AI scribe. The patient is a 33 year old female with the above listed medical problems referred for recommendations regarding sinus tachycardia.  On review of the patient's records she has issues with chronic abdominal pain.  She was seen in March 2024 in the internal medicine clinic.  At that point in time she was found to be tachycardic.  TSH and BMP were performed which was within normal limits.  An echocardiogram was ordered but never performed.  She was seen by GI in March and found to be tachycardic.  She given a history of frequent vomiting associate with epigastric pain.  Over the last year she reported vomiting 1-3 times daily.  She was drinking quite a bit of coffee at that time and she was counseled to perhaps reduce her caffeine intake.  She was later seen by internal medicine division.  She is noted to be hypertensive and tachycardic at that visit.  She was found to be mildly hypokalemic.  Hydrochlorothiazide  was increased to 25 mg, Coreg  3.125 mg twice daily was started and she was referred to cardiology.  She has been experiencing nausea and vomiting, which have progressively worsened over time. The symptoms have improved somewhat since her gastroenterologist adjusted her heartburn medication. However, she still experiences vomiting, particularly after consuming foods like pizza with a lot of sauce, pasta, spaghetti sauce, and hot dogs. Previously, vomiting occurred daily, but now it happens every other day.  She is also experiencing a high heart rate, which is currently  under evaluation. No lightheadedness, blacking out spells, swelling in her legs, or breathing difficulties when lying flat.  Her social history reveals that she stopped working in 2023 to care for her mother and aunt, whose health has declined. This change has led to weight gain and new health issues. She smokes three cigarettes a day and is attempting to quit. She also reports high anxiety, particularly when leaving home and  meeting new people, which she believes may contribute to her elevated heart rate. She experiences stress, especially since quitting work and staying at home.    Current Medications: Current Meds  Medication Sig   amLODipine  (NORVASC ) 10 MG tablet Take 1 tablet (10 mg total) by mouth daily.   carvedilol  (COREG ) 3.125 MG tablet Take 1 tablet (3.125 mg total) by mouth 2 (two) times daily.   famotidine  (PEPCID ) 20 MG tablet TAKE 1 TABLET BY MOUTH EVERYDAY AT BEDTIME   pantoprazole  (PROTONIX ) 40 MG tablet Take 1 tablet (40 mg total) by mouth daily.   [DISCONTINUED] amLODipine  (NORVASC ) 5 MG tablet Take 1 tablet (5 mg total) by mouth daily.     Review of Systems:   Please see the history of present illness.    All other systems reviewed and are negative.     EKGs/Labs/Other Test Reviewed:   EKG: 2024 sinus tachycardia, nonspecific ST and T wave changes  EKG Interpretation Date/Time:  Monday December 06 2023 14:14:57 EDT Ventricular Rate:  107 PR Interval:  152 QRS Duration:  88 QT Interval:  330 QTC Calculation: 440 R Axis:   85  Text Interpretation: Sinus tachycardia Nonspecific T wave abnormality When compared with ECG of 17-Aug-2022 16:21, No significant change was found Confirmed by Wendel Haws ([id number]) on 12/06/2023 2:20:13 PM         Risk Assessment/Calculations:          Physical Exam:   VS:  BP (!) 145/80   Pulse (!) 107   Ht 5' 2 (1.575 m)   Wt 207 lb (93.9 kg)   SpO2 98%   BMI 37.86 kg/m    HYPERTENSION CONTROL Vitals:   12/06/23 1412 12/06/23 1429  BP: (!) 148/92 (!) 145/80    The patient's blood pressure is elevated above target today.  In order to address the patient's elevated BP: Blood pressure will be monitored at home to determine if medication changes need to be made.      Wt Readings from Last 3 Encounters:  12/06/23 207 lb (93.9 kg)  09/13/23 205 lb 12.8 oz (93.4 kg)  09/06/23 207 lb (93.9 kg)      GENERAL:  No apparent distress, AOx3 HEENT:   No carotid bruits, +2 carotid impulses, no scleral icterus CAR: Tachycardic, no murmurs, gallops, rubs, or thrills RES:  Clear to auscultation bilaterally ABD:  Soft, nontender, nondistended, positive bowel sounds x 4 VASC:  +2 radial pulses, +2 carotid pulses NEURO:  CN 2-12 grossly intact; motor and sensory grossly intact PSYCH:  No active depression or anxiety EXT:  No edema, ecchymosis, or cyanosis  Signed, Quavis Klutz K Tonika Eden, MD  12/06/2023 2:52 PM    [facility] [address] [place], [place], [place]  [address] Phone: [phone]; Fax: [phone]   Note:  This document was prepared using Dragon voice recognition software and may include unintentional dictation errors.

## 2023-12-06 ENCOUNTER — Ambulatory Visit: Attending: Internal Medicine | Admitting: Internal Medicine

## 2023-12-06 ENCOUNTER — Encounter: Payer: Self-pay | Admitting: Internal Medicine

## 2023-12-06 ENCOUNTER — Ambulatory Visit

## 2023-12-06 VITALS — BP 145/80 | HR 107 | Ht 62.0 in | Wt 207.0 lb

## 2023-12-06 DIAGNOSIS — Z720 Tobacco use: Secondary | ICD-10-CM | POA: Diagnosis not present

## 2023-12-06 DIAGNOSIS — R000 Tachycardia, unspecified: Secondary | ICD-10-CM | POA: Diagnosis not present

## 2023-12-06 DIAGNOSIS — I10 Essential (primary) hypertension: Secondary | ICD-10-CM | POA: Diagnosis not present

## 2023-12-06 DIAGNOSIS — Z01810 Encounter for preprocedural cardiovascular examination: Secondary | ICD-10-CM | POA: Diagnosis not present

## 2023-12-06 MED ORDER — AMLODIPINE BESYLATE 10 MG PO TABS: 10.0000 mg | ORAL_TABLET | Freq: Every day | ORAL | 3 refills | Status: AC

## 2023-12-06 NOTE — Progress Notes (Unsigned)
 Enrolled for [employer] to mail a ZIO XT long term holter monitor to the patients address on file.

## 2023-12-06 NOTE — Patient Instructions (Addendum)
 Medication Instructions:  Your physician has recommended you make the following change in your medication:  1.) increase amlodipine  - take 10 mg --one tablet daily  *If you need a refill on your cardiac medications before your next appointment, please call your pharmacy*  Lab Work: none  Testing/Procedures: Your physician has requested that you have an echocardiogram. Echocardiography is a painless test that uses sound waves to create images of your heart. It provides your doctor with information about the size and shape of your heart and how well your heart's chambers and valves are working. This procedure takes approximately one hour. There are no restrictions for this procedure. Please do NOT wear cologne, perfume, aftershave, or lotions (deodorant is allowed). Please arrive 15 minutes prior to your appointment time.  Please note: We ask at that you not bring children with you during ultrasound (echo/ vascular) testing. Due to room size and safety concerns, children are not allowed in the ultrasound rooms during exams. Our front office staff cannot provide observation of children in our lobby area while testing is being conducted. An adult accompanying a patient to their appointment will only be allowed in the ultrasound room at the discretion of the ultrasound technician under special circumstances. We apologize for any inconvenience.  Zio XT heart monitor - see instructions below  Follow-Up: As needed  ZIO XT- Long Term Monitor Instructions  Your physician has requested you wear a ZIO patch monitor for 3days.  This is a single patch monitor. [employer] supplies one patch monitor per enrollment. Additional stickers are not available. Please do not apply patch if you will be having a Nuclear Stress Test,  Echocardiogram, Cardiac CT, MRI, or Chest Xray during the period you would be wearing the  monitor. The patch cannot be worn during these tests. You cannot remove and re-apply the  ZIO  XT patch monitor.  Your ZIO patch monitor will be mailed 3 day USPS to your address on file. It may take 3-5 days  to receive your monitor after you have been enrolled.  Once you have received your monitor, please review the enclosed instructions. Your monitor  has already been registered assigning a specific monitor serial # to you.  Billing and Patient Assistance Program Information  We have supplied [employer] with any of your insurance information on file for billing purposes. [employer] offers a sliding scale Patient Assistance Program for patients that do not have  insurance, or whose insurance does not completely cover the cost of the ZIO monitor.  You must apply for the Patient Assistance Program to qualify for this discounted rate.  To apply, please call [employer] at [phone], select option 4, select option 2, ask to apply for  Patient Assistance Program. Meredeth will ask your household income, and how many people  are in your household. They will quote your out-of-pocket cost based on that information.  [employer] will also be able to set up a 37-month, interest-free payment plan if needed.  Applying the monitor  Hold abrader disc by orange tab. Rub abrader in 40 strokes over the upper left chest as  indicated in your monitor instructions.  Clean area with 4 enclosed alcohol pads. Let dry.  Apply patch as indicated in monitor instructions. Patch will be placed under collarbone on left  side of chest with arrow pointing upward.  Rub patch adhesive wings for 2 minutes. Remove white label marked 1. Remove the white  label marked 2. Rub patch adhesive wings for 2 additional minutes.  While  looking in a mirror, press and release button in center of patch. A small green light will  flash 3-4 times. This will be your only indicator that the monitor has been turned on.  Do not shower for the first 24 hours. You may shower after the first 24 hours.  Press the button if you feel a symptom.  You will hear a small click. Record Date, Time and  Symptom in the Patient Logbook.  When you are ready to remove the patch, follow instructions on the last 2 pages of Patient  Logbook. Stick patch monitor onto the last page of Patient Logbook.  Place Patient Logbook in the blue and white box. Use locking tab on box and tape box closed  securely. The blue and white box has prepaid postage on it. Please place it in the mailbox as  soon as possible. Your physician should have your test results approximately 7 days after the  monitor has been mailed back to [facility].  Call [facility] Customer Care at [phone] if you have questions regarding  your ZIO XT patch monitor. Call them immediately if you see an orange light blinking on your  monitor.  If your monitor falls off in less than 4 days, contact our Monitor department at [phone].  If your monitor becomes loose or falls off after 4 days call [employer] at [phone] for  suggestions on securing your monitor

## 2023-12-16 ENCOUNTER — Other Ambulatory Visit: Payer: Self-pay | Admitting: Nurse Practitioner

## 2023-12-21 ENCOUNTER — Other Ambulatory Visit: Payer: Self-pay | Admitting: Nurse Practitioner

## 2023-12-21 NOTE — Addendum Note (Signed)
 Addended by: RENNE HOMANS on: 12/21/2023 11:39 AM   Modules accepted: Orders

## 2023-12-26 ENCOUNTER — Telehealth: Payer: Self-pay | Admitting: Nurse Practitioner

## 2023-12-26 DIAGNOSIS — R1013 Epigastric pain: Secondary | ICD-10-CM

## 2023-12-26 DIAGNOSIS — R112 Nausea with vomiting, unspecified: Secondary | ICD-10-CM

## 2023-12-26 DIAGNOSIS — K219 Gastro-esophageal reflux disease without esophagitis: Secondary | ICD-10-CM

## 2023-12-26 NOTE — Telephone Encounter (Signed)
 Dr. Leigh, refer to office visit 09/06/2023, at that time a cardiac clearance was requested due to tachycardia (HR 120s - 130s) prior to scheduling an EGD. Patient was seen by cardiologist Dr. Lurena Red on 12/06/2023 and cardiac clearance was provided as follows: Preoperative cardiovascular semination: EGD is low risk procedure cardiovascular perspective; no further evaluation is required..   However, Dr. Red, ordered an ECHO due to her tachycardia (though he thought her sinus tachycardia was likely due to  chronic abdominal pain with frequent vomiting (one to three times daily over the last year) with hypokalemia this is likely due to volume depletion as well as pain in the context of daily caffeine use.  Anxiety and tobacco abuse likely also contributing.  Her ECHO is not scheduled until 01/24/2024.  Dr. Leigh, please verify if you want to wait until ECHO completed before scheduling patient for EGD or ok to schedule EGD now as cardiac clearance was provided. THX.

## 2023-12-27 NOTE — Telephone Encounter (Signed)
 Pt scheduled for egd in the [facility] 02/02/24 @10 :30am. Pt aware of appt. Pt wanted to know if there were any Monday appts. Let her know the first Monday was Sept 15th. Pt will check with her transportation and let us  know if she can keep the appt as scheduled or if she needs to move the appt to Sept.

## 2023-12-27 NOTE — Telephone Encounter (Signed)
 Thanks Colleen - sounds like her EGD is for chronic symptoms. I think best to get the echo back first to make sure okay. My first available [facility] spot is after 8/11 regardless. I would book her for the mid to end of August so the echo is done first. Thanks

## 2023-12-27 NOTE — Telephone Encounter (Signed)
 Linda/Dottie, refer to Dr. Hassan message below. Pls contact patient and schedule for an EGD in [facility] with Dr. Leigh mid to late August, will need to review ECHO results (scheduled 8/11) prior to patient proceeding with EGD. THX.

## 2023-12-28 NOTE — Addendum Note (Signed)
 Addended by: CLAUDENE NAOMIE SAILOR on: 12/28/2023 03:35 PM   Modules accepted: Orders

## 2023-12-28 NOTE — Telephone Encounter (Signed)
 Patient states she needs procedure completed on a Monday due to transportation. She has been rescheduled to the first available Monday appointment, 02/28/24 at 8 am. Patient has been advised of revised time/date/location for upcoming procedure and has been given generalized verbal prep instructions. Discussed that a care partner 18 years or older should bring her, stay for the procedure and drive home due to sedation. Written instructions have been made available to the patient for additional review in mychart.  Will also keep watch on chart for ECHO that should result 01/24/24 or after.

## 2024-01-07 ENCOUNTER — Ambulatory Visit: Payer: Self-pay | Admitting: Internal Medicine

## 2024-01-07 DIAGNOSIS — R000 Tachycardia, unspecified: Secondary | ICD-10-CM | POA: Diagnosis not present

## 2024-01-11 ENCOUNTER — Other Ambulatory Visit: Payer: Self-pay | Admitting: Nurse Practitioner

## 2024-01-24 ENCOUNTER — Ambulatory Visit (HOSPITAL_COMMUNITY)

## 2024-02-02 ENCOUNTER — Encounter: Admitting: Gastroenterology

## 2024-02-07 ENCOUNTER — Telehealth: Payer: Self-pay | Admitting: Nurse Practitioner

## 2024-02-07 NOTE — Telephone Encounter (Signed)
 I have spoken to patient and moved her endoscopy date to 03/27/24 at 9 am pending her echo results 03/06/24 are favorable. Patient verbalizes understanding.

## 2024-02-07 NOTE — Telephone Encounter (Signed)
 Dr. Leigh, refer to office visit 09/06/2023, at that time a cardiac clearance was requested due to tachycardia (HR 120s - 130s) prior to scheduling an EGD. Patient was seen by cardiologist Dr. Lurena Red on 12/06/2023 and cardiac clearance was provided as follows: Preoperative cardiovascular semination: EGD is low risk procedure cardiovascular perspective; no further evaluation is required..    However, Dr. Red, ordered an ECHO  at that time due to her tachycardia (though he thought her sinus tachycardia was likely due to  chronic abdominal pain with frequent vomiting (one to three times daily over the last year) with hypokalemia this is likely due to volume depletion as well as pain in the context of daily caffeine use.  Anxiety and tobacco abuse likely also contributing.  Refer to our phone communication on 7/13 - 12/27/2023.  At that time her ECHO was initially scheduled 01/24/2024 and you recommended for  the patient to complete the ECHO prior to proceeding with an EGD. Her EGD was scheduled 02/28/2024 to give us  enough time to review her ECHO. However, looks like she rescheduled her ECHO to 03/06/2024.  Dottie/Linda, please contact the patient and let her know since her ECHO was to rescheduled 03/06/2024 her EGD 9/15  will need to be scheduled to a later date.  Please cancel her 9/15/ EGD and reschedule it at least 1 to 2 weeks after her ECHO date.  Thank you.

## 2024-02-07 NOTE — Telephone Encounter (Signed)
 Thanks Elida - I agree - POD A RN can you please adjust the schedule - EGD needs to be done a few weeks after Echo, please tell the patient she must have that done in order to do the EGD. Thanks

## 2024-02-28 ENCOUNTER — Encounter: Admitting: Gastroenterology

## 2024-03-06 ENCOUNTER — Ambulatory Visit (HOSPITAL_COMMUNITY)
Admission: RE | Admit: 2024-03-06 | Discharge: 2024-03-06 | Disposition: A | Discharge status: Home / Self Care | Source: Ambulatory Visit | Attending: Cardiology | Admitting: Cardiology

## 2024-03-06 DIAGNOSIS — I10 Essential (primary) hypertension: Secondary | ICD-10-CM

## 2024-03-06 DIAGNOSIS — R000 Tachycardia, unspecified: Secondary | ICD-10-CM | POA: Diagnosis not present

## 2024-03-06 LAB — ECHOCARDIOGRAM COMPLETE
Area-P 1/2: 4.65 cm2
S' Lateral: 2.87 cm

## 2024-03-10 NOTE — Telephone Encounter (Signed)
 Echo looks okay, okay to proceed

## 2024-03-10 NOTE — Telephone Encounter (Signed)
 Patient did have her echo on 03/06/24. Please review and advise if we can move forward with endoscopy 03/27/24 as scheduled.

## 2024-03-27 ENCOUNTER — Encounter: Payer: Self-pay | Admitting: Gastroenterology

## 2024-03-27 ENCOUNTER — Ambulatory Visit (AMBULATORY_SURGERY_CENTER): Admitting: Gastroenterology

## 2024-03-27 VITALS — BP 134/98 | HR 85 | Temp 97.0°F | Resp 15 | Wt 208.0 lb

## 2024-03-27 DIAGNOSIS — R112 Nausea with vomiting, unspecified: Secondary | ICD-10-CM

## 2024-03-27 DIAGNOSIS — K219 Gastro-esophageal reflux disease without esophagitis: Secondary | ICD-10-CM | POA: Diagnosis not present

## 2024-03-27 DIAGNOSIS — K3189 Other diseases of stomach and duodenum: Secondary | ICD-10-CM

## 2024-03-27 DIAGNOSIS — R1013 Epigastric pain: Secondary | ICD-10-CM

## 2024-03-27 DIAGNOSIS — K319 Disease of stomach and duodenum, unspecified: Secondary | ICD-10-CM | POA: Diagnosis not present

## 2024-03-27 MED ORDER — ONDANSETRON HCL 4 MG PO TABS: 4.0000 mg | ORAL_TABLET | Freq: Three times a day (TID) | ORAL | 1 refills | Status: DC | PRN

## 2024-03-27 MED ORDER — SODIUM CHLORIDE 0.9 % IV SOLN: 500.0000 mL | Freq: Once | INTRAVENOUS | Status: DC

## 2024-03-27 NOTE — Patient Instructions (Signed)
 YOU HAD AN ENDOSCOPIC PROCEDURE TODAY AT THE [facility]:   Refer to the procedure report that was given to you for any specific questions about what was found during the examination.  If the procedure report does not answer your questions, please call your gastroenterologist to clarify.  If you requested that your care partner not be given the details of your procedure findings, then the procedure report has been included in a sealed envelope for you to review at your convenience later.  YOU SHOULD EXPECT: Some feelings of bloating in the abdomen. Passage of more gas than usual.  Walking can help get rid of the air that was put into your GI tract during the procedure and reduce the bloating. If you had a lower endoscopy (such as a colonoscopy or flexible sigmoidoscopy) you may notice spotting of blood in your stool or on the toilet paper. If you underwent a bowel prep for your procedure, you may not have a normal bowel movement for a few days.  Please Note:  You might notice some irritation and congestion in your nose or some drainage.  This is from the oxygen used during your procedure.  There is no need for concern and it should clear up in a day or so.  SYMPTOMS TO REPORT IMMEDIATELY:  Following upper endoscopy (EGD)  Vomiting of blood or coffee ground material  New chest pain or pain under the shoulder blades  Painful or persistently difficult swallowing  New shortness of breath  Fever of 100F or higher  Black, tarry-looking stools  Resume previous diet Continue present medications Continue Protonix  if that has helped control reflux   For urgent or emergent issues, a gastroenterologist can be reached at any hour by calling [phone]. Do not use MyChart messaging for urgent concerns.    DIET:  We do recommend a small meal at first, but then you may proceed to your regular diet.  Drink plenty of fluids but you should avoid alcoholic beverages for 24 hours.  ACTIVITY:   You should plan to take it easy for the rest of today and you should NOT DRIVE or use heavy machinery until tomorrow (because of the sedation medicines used during the test).    FOLLOW UP: Our staff will call the number listed on your records the next business day following your procedure.  We will call around 7:15- 8:00 am to check on you and address any questions or concerns that you may have regarding the information given to you following your procedure. If we do not reach you, we will leave a message.     If any biopsies were taken you will be contacted by phone or by letter within the next 1-3 weeks.  Please call us  at [phone] if you have not heard about the biopsies in 3 weeks.    SIGNATURES/CONFIDENTIALITY: You and/or your care partner have signed paperwork which will be entered into your electronic medical record.  These signatures attest to the fact that that the information above on your After Visit Summary has been reviewed and is understood.  Full responsibility of the confidentiality of this discharge information lies with you and/or your care-partner.

## 2024-03-27 NOTE — Progress Notes (Signed)
 To PACU via stretcher, sedated, good respiratory effort, VSS.

## 2024-03-27 NOTE — Progress Notes (Signed)
 Called to room to assist during endoscopic procedure.  Patient ID and intended procedure confirmed with present staff. Received instructions for my participation in the procedure from the performing physician.

## 2024-03-27 NOTE — Op Note (Signed)
 [facility] Patient Name: Alicia Rodriguez Procedure Date: 03/27/2024 10:03 AM MRN: [id number] Endoscopist: Elspeth P. Leigh , MD, [id number] Age: 33 Referring MD:  Date of Birth: 1990-11-08 Gender: Female Account #: [id number] Procedure:                Upper GI endoscopy Indications:              Epigastric abdominal pain, Follow-up of                            gastro-esophageal reflux disease, Nausea with                            vomiting - reflux and vomiting improved on protonix                             40mg  / day and pepcid , still has epigastric pain                            and nausea Medicines:                Monitored Anesthesia Care Procedure:                Pre-Anesthesia Assessment:                           - Prior to the procedure, a History and Physical                            was performed, and patient medications and                            allergies were reviewed. The patient's tolerance of                            previous anesthesia was also reviewed. The risks                            and benefits of the procedure and the sedation                            options and risks were discussed with the patient.                            All questions were answered, and informed consent                            was obtained. Prior Anticoagulants: The patient has                            taken no anticoagulant or antiplatelet agents. ASA                            Grade Assessment: II - A patient with mild systemic  disease. After reviewing the risks and benefits,                            the patient was deemed in satisfactory condition to                            undergo the procedure.                           After obtaining informed consent, the endoscope was                            passed under direct vision. Throughout the                            procedure, the patient's blood pressure, pulse,  and                            oxygen saturations were monitored continuously. The                            Olympus Scope [id number] was introduced through the                            mouth, and advanced to the second part of duodenum.                            The upper GI endoscopy was accomplished without                            difficulty. The patient tolerated the procedure                            well. Scope In: Scope Out: Findings:                 Esophagogastric landmarks were identified: the                            Z-line was found at 36 cm, the gastroesophageal                            junction was found at 36 cm and the upper extent of                            the gastric folds was found at 36 cm from the                            incisors.                           The exam of the esophagus was otherwise normal.                           The entire examined stomach was  normal. Biopsies                            were taken with a cold forceps for Helicobacter                            pylori testing.                           The examined duodenum was normal. Complications:            No immediate complications. Estimated blood loss:                            Minimal. Estimated Blood Loss:     Estimated blood loss was minimal. Impression:               - Esophagogastric landmarks identified.                           - Normal esophagus otherwise.                           - Normal stomach. Biopsied.                           - Normal examined duodenum. Recommendation:           - Patient has a contact number available for                            emergencies. The signs and symptoms of potential                            delayed complications were discussed with the                            patient. Return to normal activities tomorrow.                            Written discharge instructions were provided to the                            patient.                            - Resume previous diet.                           - Continue present medications.                           - Continue protonix  if that has helped control                            reflux                           - Add Zofran  4mg  ODT every  8 hours PRN                           - Please schedule RUQ US  as previously ordered,                            rule out gallstones, as next steps for persistent                            intermittent epigastric pain                           - Await pathology results. Elspeth P. Alicia Bertucci, MD 03/27/2024 10:23:39 AM This report has been signed electronically.

## 2024-03-27 NOTE — Progress Notes (Signed)
 [facility] History and Physical   Primary Care Physician:  Tobie Gaines, DO   Reason for Procedure:   GERD, epigastric pain, nausea / vomiting  Plan:    EGD     HPI: Alicia Rodriguez is a 33 y.o. female  here for EGD. She has had GERD, epigastric pain, nausea / vomiting. On pepcid  and protonix  40mg  currently. States these measures have helped, reflux is better controlled and less nausea but still having intermittent epigastric pain, especially in the AM. EGD to evaluate. She denies NSAIDs, denies marijuana use.  Otherwise feels well without any cardiopulmonary symptoms.   I have discussed risks / benefits of anesthesia and endoscopic procedure with Alicia S Kalka and they wish to proceed with the exams as outlined today.    Past Medical History:  Diagnosis Date   GERD (gastroesophageal reflux disease)    Hypertension    Sinus tachycardia     Past Surgical History:  Procedure Laterality Date   TONSILLECTOMY Bilateral    WISDOM TOOTH EXTRACTION      Prior to Admission medications   Medication Sig Start Date End Date Taking? Authorizing Provider  amLODipine  (NORVASC ) 10 MG tablet Take 1 tablet (10 mg total) by mouth daily. 12/06/23  Yes Thukkani, Arun K, MD  carvedilol  (COREG ) 3.125 MG tablet Take 1 tablet (3.125 mg total) by mouth 2 (two) times daily. 09/13/23 09/12/24 Yes Juberg, Lonni, DO  famotidine  (PEPCID ) 20 MG tablet TAKE 1 TABLET BY MOUTH EVERYDAY AT BEDTIME 12/21/23  Yes Kennedy-Smith, Colleen M, NP  pantoprazole  (PROTONIX ) 40 MG tablet TAKE 1 TABLET BY MOUTH EVERY DAY 01/11/24  Yes Kennedy-Smith, Colleen M, NP  mometasone  (ELOCON ) 0.1 % cream Apply 1 Application topically daily. 03/05/23   Tobie Gaines, DO  potassium chloride  SA (KLOR-CON  M) 20 MEQ tablet Take 1 tablet (20 mEq total) by mouth 3 (three) times daily for 10 days. Take with meals 09/17/23 09/27/23  Harrie Lonni, DO    Current Outpatient Medications  Medication Sig Dispense Refill    amLODipine  (NORVASC ) 10 MG tablet Take 1 tablet (10 mg total) by mouth daily. 90 tablet 3   carvedilol  (COREG ) 3.125 MG tablet Take 1 tablet (3.125 mg total) by mouth 2 (two) times daily. 60 tablet 11   famotidine  (PEPCID ) 20 MG tablet TAKE 1 TABLET BY MOUTH EVERYDAY AT BEDTIME 90 tablet 1   pantoprazole  (PROTONIX ) 40 MG tablet TAKE 1 TABLET BY MOUTH EVERY DAY 90 tablet 1   mometasone  (ELOCON ) 0.1 % cream Apply 1 Application topically daily. 45 g 3   potassium chloride  SA (KLOR-CON  M) 20 MEQ tablet Take 1 tablet (20 mEq total) by mouth 3 (three) times daily for 10 days. Take with meals 20 tablet 0   Current Facility-Administered Medications  Medication Dose Route Frequency Provider Last Rate Last Admin   0.9 %  sodium chloride  infusion  500 mL Intravenous Once Eladia Frame, Elspeth SQUIBB, MD        Allergies as of 03/27/2024 - Review Complete 03/27/2024  Allergen Reaction Noted   Penicillins Swelling 03/18/2021    Family History  Problem Relation Age of Onset   Hyperlipidemia Mother    Hypertension Mother    Diabetes Mother    Cancer - Cervical Maternal Grandmother    Bone cancer Maternal Grandfather    Other Neg Hx    Esophageal cancer Neg Hx    Rectal cancer Neg Hx    Stomach cancer Neg Hx    Colon cancer Neg Hx  Social History   Socioeconomic History   Marital status: Single    Spouse name: Not on file   Number of children: Not on file   Years of education: Not on file   Highest education level: Not on file  Occupational History   Not on file  Tobacco Use   Smoking status: Every Day    Current packs/day: 0.50    Average packs/day: 0.5 packs/day for 2.0 years (1.0 ttl pk-yrs)    Types: Cigarettes   Smokeless tobacco: Current  Vaping Use   Vaping status: Never Used  Substance and Sexual Activity   Alcohol use: Not Currently   Drug use: No   Sexual activity: Yes    Birth control/protection: None  Other Topics Concern   Not on file  Social History Narrative   Not  on file   Social Drivers of Health   Financial Resource Strain: Not on file  Food Insecurity: No Food Insecurity (08/17/2022)   Hunger Vital Sign    Worried About Running Out of Food in the Last Year: Never true    Ran Out of Food in the Last Year: Never true  Transportation Needs: Not on file  Physical Activity: Not on file  Stress: Not on file  Social Connections: Not on file  Intimate Partner Violence: Not At Risk (08/17/2022)   Humiliation, Afraid, Rape, and Kick questionnaire    Fear of Current or Ex-Partner: No    Emotionally Abused: No    Physically Abused: No    Sexually Abused: No    Review of Systems: All other review of systems negative except as mentioned in the HPI.  Physical Exam: Vital signs BP 134/81   Pulse (!) 104   Temp (!) 97 F (36.1 C)   Wt 208 lb (94.3 kg)   LMP 03/21/2024 (Exact Date)   SpO2 100%   BMI 38.04 kg/m   General:   Alert,  Well-developed, pleasant and cooperative in NAD Lungs:  Clear throughout to auscultation.   Heart:  Regular rate and rhythm Abdomen:  Soft, nontender and nondistended.   Neuro/Psych:  Alert and cooperative. Normal mood and affect. A and O x 3  Marcey Naval, MD [facility]

## 2024-03-28 ENCOUNTER — Telehealth: Payer: Self-pay | Admitting: *Deleted

## 2024-03-28 NOTE — Telephone Encounter (Signed)
Attempted f/u phone call. No answer. No voicemail, unable to leave message.  

## 2024-03-30 ENCOUNTER — Ambulatory Visit: Payer: Self-pay | Admitting: Gastroenterology

## 2024-03-30 LAB — SURGICAL PATHOLOGY

## 2024-06-25 ENCOUNTER — Other Ambulatory Visit: Payer: Self-pay | Admitting: Gastroenterology

## 2024-08-21 ENCOUNTER — Ambulatory Visit: Payer: Self-pay | Admitting: Student
